# Patient Record
Sex: Female | Born: 1970 | Hispanic: No
Health system: Midwestern US, Community
[De-identification: ages and names within clinical notes are randomized; demographics above are authoritative.]

## PROBLEM LIST (undated history)

## (undated) DIAGNOSIS — G43909 Migraine, unspecified, not intractable, without status migrainosus: Secondary | ICD-10-CM

## (undated) DIAGNOSIS — R079 Chest pain, unspecified: Secondary | ICD-10-CM

## (undated) HISTORY — PX: ABDOMINAL HYSTERECTOMY: SHX81

---

## 2004-09-12 ENCOUNTER — Emergency Department: Payer: Self-pay | Admitting: Emergency Medicine

## 2005-03-25 ENCOUNTER — Emergency Department: Payer: Self-pay | Admitting: Emergency Medicine

## 2008-10-15 ENCOUNTER — Emergency Department: Payer: Self-pay | Admitting: Internal Medicine

## 2016-05-18 ENCOUNTER — Encounter: Payer: Self-pay | Admitting: *Deleted

## 2016-05-18 ENCOUNTER — Emergency Department
Admission: EM | Admit: 2016-05-18 | Discharge: 2016-05-18 | Disposition: A | Discharge status: Home / Self Care | Payer: Self-pay | Attending: Emergency Medicine | Admitting: Emergency Medicine

## 2016-05-18 ENCOUNTER — Emergency Department: Payer: Self-pay

## 2016-05-18 DIAGNOSIS — S63502A Unspecified sprain of left wrist, initial encounter: Secondary | ICD-10-CM | POA: Insufficient documentation

## 2016-05-18 DIAGNOSIS — Y999 Unspecified external cause status: Secondary | ICD-10-CM | POA: Insufficient documentation

## 2016-05-18 DIAGNOSIS — W010XXA Fall on same level from slipping, tripping and stumbling without subsequent striking against object, initial encounter: Secondary | ICD-10-CM | POA: Insufficient documentation

## 2016-05-18 DIAGNOSIS — Y929 Unspecified place or not applicable: Secondary | ICD-10-CM | POA: Insufficient documentation

## 2016-05-18 DIAGNOSIS — Y939 Activity, unspecified: Secondary | ICD-10-CM | POA: Insufficient documentation

## 2016-05-18 MED ORDER — NAPROXEN 500 MG PO TABS: 500.0000 mg | ORAL_TABLET | Freq: Two times a day (BID) | ORAL | 0 refills | Status: AC

## 2016-05-18 NOTE — ED Triage Notes (Addendum)
Pt states she tripped and fell and now has left wrist pain, able to move fingers, radial pulse papable

## 2016-05-18 NOTE — ED Provider Notes (Signed)
[facility] Emergency Department Provider Note  ____________________________________________  Time seen: Approximately 5:16 PM  I have reviewed the triage vital signs and the nursing notes.   HISTORY  Chief Complaint Wrist Pain    HPI Melinda Kennedy is a 45 y.o. female , NAD, presents to emergency for evaluation of left wrist pain. Patient states that when she woke up this afternoon, she got out of the bed but her legs were still "asleep" causing her to fall floor. States she placed her arms out in front of her to brace her fall. Had immediate pain about the left wrist with some swelling and now has difficulty moving the left wrist. States the pain can radiate into the forearm but denies any elbow, upper arm or shoulder pain. Does have mild tingling about the dorsal surface of the left wrist but no numbness or weakness. Denies head injury, LOC, lightheadedness or dizziness. No abdominal pain, nausea, vomiting. No chest pain or shortness of breath. Has no neck or back pain. Has been able to ambulate without difficulties. Denies any open wounds or lacerations.   History reviewed. No pertinent past medical history.  There are no active problems to display for this patient.   History reviewed. No pertinent surgical history.  Prior to Admission medications   Medication Sig Start Date End Date Taking? Authorizing Provider  naproxen (NAPROSYN) 500 MG tablet Take 1 tablet (500 mg total) by mouth 2 (two) times daily with a meal. 05/18/16   Cameka Rae L Sheyna Pettibone, PA-C    Allergies Levaquin [levofloxacin]  History reviewed. No pertinent family history.  Social History Social History  Substance Use Topics  . Smoking status: Not on file  . Smokeless tobacco: Not on file  . Alcohol use Not on file     Review of Systems  Constitutional: No fatigue Eyes: No visual changes.  Cardiovascular: No chest pain. Respiratory: No shortness of breath.  Gastrointestinal: No  abdominal pain.  No nausea, vomiting.  Musculoskeletal:Positive left wrist pain. Negative for back, Neck, shoulder, elbow and pain.  Skin: Eyes of swelling and bruising left wrist. Negative for rash, redness, warmth, open wounds or lacerations. Neurological: Positive tingling dorsal left wrist but no weakness or numbness. No LOC, lightheadedness, dizziness. 10-point ROS otherwise negative.  ____________________________________________   PHYSICAL EXAM:  VITAL SIGNS: ED Triage Vitals  Enc Vitals Group     BP 05/18/16 1711 134/84     Pulse Rate 05/18/16 1711 88     Resp 05/18/16 1711 18     Temp 05/18/16 1711 98.2 F (36.8 C)     Temp Source 05/18/16 1711 Oral     SpO2 05/18/16 1711 97 %     Weight 05/18/16 1708 160 lb (72.6 kg)     Height 05/18/16 1708 5\' 3"  (1.6 m)     Head Circumference --      Peak Flow --      Pain Score 05/18/16 1708 8     Pain Loc --      Pain Edu? --      Excl. in GC? --      Constitutional: Alert and oriented. Well appearing and in no acute distress. Eyes: Conjunctivae are normal Without icterus, injection or hemorrhage.  Head: Atraumatic. Cardiovascular: Good peripheral circulation with 2+ pulses noted in the left upper extremity. Capillary refill is brisk in all digits of left hand. Respiratory: Normal respiratory effort without tachypnea or retractions.  Musculoskeletal: Decreased range of motion of the left wrist due  to pain. Full range of motion of all digits on the left hand without difficulty. No tenderness to palpation about the digits of left hand, the left hand nor the middle or proximal forearm. Tenderness to palpation about the dorsal left wrist with point of maximal tenderness about the ulna without crepitus or bony abnormalities. Full range of motion of left elbow without pain. Full range of motion of the right elbow without pain or difficulty. Neurologic:  Normal speech and language. No gross focal neurologic deficits are appreciated.  Sensation to light touch first contact of left upper extremity. Skin:  Mild swelling and trace ecchymosis is noted about the dorsal left wrist. Skin is warm, dry and intact. No rash, redness, warmth, wounds or lacerations noted. Psychiatric: Mood and affect are normal. Speech and behavior are normal. Patient exhibits appropriate insight and judgement.   ____________________________________________   LABS  None ____________________________________________  EKG  None ____________________________________________  RADIOLOGY I, Hope PigeonJami L Anwyn Kriegel, personally viewed and evaluated these images (plain radiographs) as part of my medical decision making, as well as reviewing the written report by the radiologist.  Dg Wrist Complete Left  Result Date: 05/18/2016 CLINICAL DATA:  Fall on outstretched arm today. Left wrist pain. Initial encounter. EXAM: LEFT WRIST - COMPLETE 3+ VIEW COMPARISON:  None. FINDINGS: There is no evidence of fracture or dislocation. There is no evidence of arthropathy or other focal bone abnormality. Soft tissues are unremarkable. IMPRESSION: Negative. Electronically Signed   By: Myles RosenthalJohn  Stahl M.D.   On: 05/18/2016 17:52    ____________________________________________    PROCEDURES  Procedure(s) performed: None   Procedures   Medications - No data to display   ____________________________________________   INITIAL IMPRESSION / ASSESSMENT AND PLAN / ED COURSE  Pertinent labs & imaging results that were available during my care of the patient were reviewed by me and considered in my medical decision making (see chart for details).  Clinical Course     Patient's diagnosis is consistent with Left wrist sprain. Patient placed in a left cockup wrist splint for comfort care. Patient will be discharged home with prescriptions for naproxen to take as needed. Patient is to follow up with Dr. Hyacinth MeekerMiller in orthopedics in 1 week if symptoms persist past this treatment  course. Patient is given ED precautions to return to the ED for any worsening or new symptoms.   ____________________________________________  FINAL CLINICAL IMPRESSION(S) / ED DIAGNOSES  Final diagnoses:  Sprain of left wrist, initial encounter      NEW MEDICATIONS STARTED DURING THIS VISIT:  New Prescriptions   NAPROXEN (NAPROSYN) 500 MG TABLET    Take 1 tablet (500 mg total) by mouth 2 (two) times daily with a meal.         Hope PigeonJami L Elchonon Maxson, PA-C 05/18/16 1810    Arnaldo NatalPaul F Malinda, MD 05/18/16 2028

## 2016-11-01 ENCOUNTER — Emergency Department
Admission: EM | Admit: 2016-11-01 | Discharge: 2016-11-01 | Disposition: A | Discharge status: Home / Self Care | Payer: Self-pay | Attending: Emergency Medicine | Admitting: Emergency Medicine

## 2016-11-01 DIAGNOSIS — J0100 Acute maxillary sinusitis, unspecified: Secondary | ICD-10-CM | POA: Insufficient documentation

## 2016-11-01 DIAGNOSIS — F17210 Nicotine dependence, cigarettes, uncomplicated: Secondary | ICD-10-CM | POA: Insufficient documentation

## 2016-11-01 MED ORDER — BENZONATATE 100 MG PO CAPS: 200.0000 mg | ORAL_CAPSULE | Freq: Three times a day (TID) | ORAL | 0 refills | Status: AC | PRN

## 2016-11-01 MED ORDER — FEXOFENADINE-PSEUDOEPHED ER 60-120 MG PO TB12: 1.0000 | ORAL_TABLET | Freq: Two times a day (BID) | ORAL | 0 refills | Status: AC

## 2016-11-01 MED ORDER — AMOXICILLIN 500 MG PO CAPS
500.0000 mg | ORAL_CAPSULE | Freq: Three times a day (TID) | ORAL | 0 refills | Status: DC
Start: 2016-11-01 — End: 2016-12-26

## 2016-11-01 NOTE — ED Notes (Signed)
Pt reports to ED w/ c/o nasal congestion and cough x 1 week.  Pt A/OX4, resp even and unlabored. Pt able to speak in complete sentences w/o issue

## 2016-11-01 NOTE — ED Provider Notes (Signed)
[facility] Emergency Department Provider Note   ____________________________________________   First MD Initiated Contact with Patient 11/01/16 1234     (approximate)  I have reviewed the triage vital signs and the nursing notes.   HISTORY  Chief Complaint Nasal Congestion and Cough    HPI Melinda PollackSaundra L Antilla is a 46 y.o. female patient complaining of 1 week of nasal congestion and nonproductive cough. Patient also complaining of ear pressure/pain and facial pain. Patient stated no relief over-the-counter preparations. Patient denies vision disturbance vertigo or nausea.patient rates her pain discomfort as 8/10. She describes the pain as "throbbing/pounding".   History reviewed. No pertinent past medical history.  There are no active problems to display for this patient.   History reviewed. No pertinent surgical history.  Prior to Admission medications   Medication Sig Start Date End Date Taking? Authorizing Provider  amoxicillin (AMOXIL) 500 MG capsule Take 1 capsule (500 mg total) by mouth 3 (three) times daily. 11/01/16   Joni ReiningSmith, Gilmore List K, PA-C  benzonatate (TESSALON PERLES) 100 MG capsule Take 2 capsules (200 mg total) by mouth 3 (three) times daily as needed for cough. 11/01/16 11/01/17  Joni ReiningSmith, Naven Giambalvo K, PA-C  fexofenadine-pseudoephedrine (ALLEGRA-D) 60-120 MG 12 hr tablet Take 1 tablet by mouth 2 (two) times daily. 11/01/16   Joni ReiningSmith, Tirzah Fross K, PA-C  naproxen (NAPROSYN) 500 MG tablet Take 1 tablet (500 mg total) by mouth 2 (two) times daily with a meal. 05/18/16   Hagler, Jami L, PA-C    Allergies Levaquin [levofloxacin]  No family history on file.  Social History Social History  Substance Use Topics  . Smoking status: Current Every Day Smoker    Packs/day: 0.50  . Smokeless tobacco: Never Used  . Alcohol use Not on file    Review of Systems  Constitutional: No fever/chills Eyes: No visual changes. ENT: No sore throat. Sinus, ear, and   pressure Cardiovascular: Denies chest pain. Respiratory: Denies shortness of breath. Nonproductive cough Gastrointestinal: No abdominal pain.  No nausea, no vomiting.  No diarrhea.  No constipation. Genitourinary: Negative for dysuria. Musculoskeletal: Negative for back pain. Skin: Negative for rash. Neurological: Negative for headaches, focal weakness or numbness. Allergic/Immunilogical: levaquin ____________________________________________   PHYSICAL EXAM:  VITAL SIGNS: ED Triage Vitals  Enc Vitals Group     BP 11/01/16 1139 (!) 126/101     Pulse Rate 11/01/16 1139 89     Resp 11/01/16 1139 15     Temp 11/01/16 1139 97.9 F (36.6 C)     Temp Source 11/01/16 1139 Oral     SpO2 11/01/16 1139 97 %     Weight 11/01/16 1138 170 lb (77.1 kg)     Height 11/01/16 1138 5\' 3"  (1.6 m)     Head Circumference --      Peak Flow --      Pain Score 11/01/16 1138 8     Pain Loc --      Pain Edu? --      Excl. in GC? --     Constitutional: Alert and oriented. Well appearing and in no acute distress. Eyes: Conjunctivae are normal. PERRL. EOMI. Head: Atraumatic. Nose: edematous nasal turbinates with thick rhinorrhea Mouth/Throat: Mucous membranes are moist.  Oropharynx non-erythematous.postnasal drainage Neck: No stridor.  No cervical spine tenderness to palpation. Cardiovascular: Normal rate, regular rhythm. Grossly normal heart sounds.  Good peripheral circulation. Respiratory: Normal respiratory effort.  No retractions. Lungs CTAB. Gastrointestinal: Soft and nontender. No distention. No abdominal bruits. No CVA  tenderness. Musculoskeletal: No lower extremity tenderness nor edema.  No joint effusions. Neurologic:  Normal speech and language. No gross focal neurologic deficits are appreciated. No gait instability. Skin:  Skin is warm, dry and intact. No rash noted. Psychiatric: Mood and affect are normal. Speech and behavior are normal.  ____________________________________________     LABS (all labs ordered are listed, but only abnormal results are displayed)  Labs Reviewed - No data to display ____________________________________________  EKG   ____________________________________________  RADIOLOGY   ____________________________________________   PROCEDURES  Procedure(s) performed: None  Procedures  Critical Care performed: No  ____________________________________________   INITIAL IMPRESSION / ASSESSMENT AND PLAN / ED COURSE  Pertinent labs & imaging results that were available during my care of the patient were reviewed by me and considered in my medical decision making (see chart for details).  Sinusitis. Patient given discharge care instructions. Patient [facility].      ____________________________________________   FINAL CLINICAL IMPRESSION(S) / ED DIAGNOSES  Final diagnoses:  Subacute maxillary sinusitis      NEW MEDICATIONS STARTED DURING THIS VISIT:  New Prescriptions   AMOXICILLIN (AMOXIL) 500 MG CAPSULE    Take 1 capsule (500 mg total) by mouth 3 (three) times daily.   BENZONATATE (TESSALON PERLES) 100 MG CAPSULE    Take 2 capsules (200 mg total) by mouth 3 (three) times daily as needed for cough.   FEXOFENADINE-PSEUDOEPHEDRINE (ALLEGRA-D) 60-120 MG 12 HR TABLET    Take 1 tablet by mouth 2 (two) times daily.     Note:  This document was prepared using Dragon voice recognition software and may include unintentional dictation errors.    Joni Reining, PA-C 11/01/16 1253    Minna Antis, MD 11/01/16 1504

## 2016-12-26 ENCOUNTER — Emergency Department
Admission: EM | Admit: 2016-12-26 | Discharge: 2016-12-26 | Disposition: A | Discharge status: Home / Self Care | Payer: Commercial Managed Care - PPO | Attending: Emergency Medicine | Admitting: Emergency Medicine

## 2016-12-26 ENCOUNTER — Encounter: Payer: Self-pay | Admitting: Emergency Medicine

## 2016-12-26 DIAGNOSIS — Z791 Long term (current) use of non-steroidal anti-inflammatories (NSAID): Secondary | ICD-10-CM | POA: Insufficient documentation

## 2016-12-26 DIAGNOSIS — Z79899 Other long term (current) drug therapy: Secondary | ICD-10-CM | POA: Diagnosis not present

## 2016-12-26 DIAGNOSIS — J029 Acute pharyngitis, unspecified: Secondary | ICD-10-CM | POA: Diagnosis not present

## 2016-12-26 DIAGNOSIS — F17200 Nicotine dependence, unspecified, uncomplicated: Secondary | ICD-10-CM | POA: Diagnosis not present

## 2016-12-26 MED ORDER — AMOXICILLIN 500 MG PO CAPS: 500.0000 mg | ORAL_CAPSULE | Freq: Two times a day (BID) | ORAL | 0 refills | Status: AC

## 2016-12-26 MED ORDER — LIDOCAINE VISCOUS 2 % MT SOLN: 10.0000 mL | OROMUCOSAL | 0 refills | Status: AC | PRN

## 2016-12-26 MED ORDER — PREDNISONE 10 MG (21) PO TBPK: ORAL_TABLET | ORAL | 0 refills | Status: AC

## 2016-12-26 NOTE — ED Notes (Signed)
See triage note  States she developed sore throat about 1 week ago   Unsure of fever but has had chills.  States pain has increased today with swallowing  Afebrile on arrival

## 2016-12-26 NOTE — ED Triage Notes (Signed)
Pt here for sore throat since Tuesday. Odynophagia. NAD. Handling secretions. No respiratory distress.

## 2016-12-26 NOTE — ED Provider Notes (Signed)
[facility] Emergency Department Provider Note  ____________________________________________  Time seen: Approximately 8:24 AM  I have reviewed the triage vital signs and the nursing notes.   HISTORY  Chief Complaint Sore Throat    HPI Melinda Kennedy is a 46 y.o. female who presents to emergency department with sore throat and nasal congestion for 1 week. Nasal congestion is improving but sore throat is not getting better. Pain is now radiating to her ears. She has taken Mucinex and gargled saltwater for symptoms. She has had chills but has not checked her temperature. No sick contacts. She is allergic to Levaquin. She smokes one pack of cigarettes per day. She denies cough, shortness of breath, chest pain, nausea, vomiting, abdominal pain.   History reviewed. No pertinent past medical history.  There are no active problems to display for this patient.   History reviewed. No pertinent surgical history.  Prior to Admission medications   Medication Sig Start Date End Date Taking? Authorizing Provider  amoxicillin (AMOXIL) 500 MG capsule Take 1 capsule (500 mg total) by mouth 2 (two) times daily. 12/26/16   Enid Derry, PA-C  benzonatate (TESSALON PERLES) 100 MG capsule Take 2 capsules (200 mg total) by mouth 3 (three) times daily as needed for cough. 11/01/16 11/01/17  Joni Reining, PA-C  fexofenadine-pseudoephedrine (ALLEGRA-D) 60-120 MG 12 hr tablet Take 1 tablet by mouth 2 (two) times daily. 11/01/16   Joni Reining, PA-C  lidocaine (XYLOCAINE) 2 % solution Use as directed 10 mLs in the mouth or throat as needed for mouth pain. 12/26/16   Enid Derry, PA-C  naproxen (NAPROSYN) 500 MG tablet Take 1 tablet (500 mg total) by mouth 2 (two) times daily with a meal. 05/18/16   Hagler, Jami L, PA-C  predniSONE (STERAPRED UNI-PAK 21 TAB) 10 MG (21) TBPK tablet Take 6 tablets on day 1, take 5 tablets on day 2, take 4 tablets on day 3, take 3 tablets on day 4,  take 2 tablets on day 5, take 1 tablet on day 6 12/26/16   Enid Derry, PA-C    Allergies Levaquin [levofloxacin]  History reviewed. No pertinent family history.  Social History Social History  Substance Use Topics  . Smoking status: Current Every Day Smoker    Packs/day: 0.50  . Smokeless tobacco: Never Used  . Alcohol use No     Review of Systems  Constitutional: Positive for chills. Eyes: No visual changes. No discharge. ENT: Positive for congestion and rhinorrhea. Cardiovascular: No chest pain. Respiratory: Negative for cough. No SOB. Gastrointestinal: No abdominal pain.  No nausea, no vomiting.  No diarrhea.  No constipation. Musculoskeletal: Negative for musculoskeletal pain. Skin: Negative for rash, abrasions, lacerations, ecchymosis. Neurological: Negative for headaches.   ____________________________________________   PHYSICAL EXAM:  VITAL SIGNS: ED Triage Vitals  Enc Vitals Group     BP 12/26/16 0741 (!) 131/92     Pulse Rate 12/26/16 0741 94     Resp 12/26/16 0741 16     Temp 12/26/16 0741 97.7 F (36.5 C)     Temp Source 12/26/16 0741 Oral     SpO2 12/26/16 0741 98 %     Weight 12/26/16 0740 170 lb (77.1 kg)     Height 12/26/16 0740 5\' 3"  (1.6 m)     Head Circumference --      Peak Flow --      Pain Score 12/26/16 0740 6     Pain Loc --  Pain Edu? --      Excl. in GC? --      Constitutional: Alert and oriented. Well appearing and in no acute distress. Eyes: Conjunctivae are normal. PERRL. EOMI. No discharge. Head: Atraumatic. ENT: No frontal and maxillary sinus tenderness.      Ears: Tympanic membranes pearly gray with good landmarks. No discharge.      Nose: Mild congestion/rhinnorhea.      Mouth/Throat: Mucous membranes are moist. Oropharynx erythematous. Tonsils not enlarged. No exudates. Uvula midline. Neck: No stridor.   Hematological/Lymphatic/Immunilogical: No cervical lymphadenopathy. Cardiovascular: Normal rate, regular  rhythm.  Good peripheral circulation. Respiratory: Normal respiratory effort without tachypnea or retractions. Lungs CTAB. Good air entry to the bases with no decreased or absent breath sounds. Gastrointestinal: Bowel sounds 4 quadrants. Soft and nontender to palpation. No guarding or rigidity. No palpable masses. No distention. Musculoskeletal: Full range of motion to all extremities. No gross deformities appreciated. Neurologic:  Normal speech and language. No gross focal neurologic deficits are appreciated.  Skin:  Skin is warm, dry and intact. No rash noted.   ____________________________________________   LABS (all labs ordered are listed, but only abnormal results are displayed)  Labs Reviewed - No data to display ____________________________________________  EKG   ____________________________________________  RADIOLOGY  No results found.  ____________________________________________    PROCEDURES  Procedure(s) performed:    Procedures    Medications - No data to display   ____________________________________________   INITIAL IMPRESSION / ASSESSMENT AND PLAN / ED COURSE  Pertinent labs & imaging results that were available during my care of the patient were reviewed by me and considered in my medical decision making (see chart for details).  Review of the [place] CSRS was performed in accordance of the [facility] prior to dispensing any controlled drugs.   Patient presented to the emergency department with sore throat for 1 week. Vital signs and exam are reassuring. Patient feels comfortable going home. Patient will be discharged home with prescriptions for amoxicillin, prednisone, Viscous Lidocaine. Patient is to follow up with PCP as needed or otherwise directed. Patient is given ED precautions to return to the ED for any worsening or new symptoms.     ____________________________________________  FINAL CLINICAL IMPRESSION(S) / ED DIAGNOSES  Final  diagnoses:  Pharyngitis, unspecified etiology      NEW MEDICATIONS STARTED DURING THIS VISIT:  Discharge Medication List as of 12/26/2016  8:29 AM    START taking these medications   Details  lidocaine (XYLOCAINE) 2 % solution Use as directed 10 mLs in the mouth or throat as needed for mouth pain., Starting Mon 12/26/2016, Print    predniSONE (STERAPRED UNI-PAK 21 TAB) 10 MG (21) TBPK tablet Take 6 tablets on day 1, take 5 tablets on day 2, take 4 tablets on day 3, take 3 tablets on day 4, take 2 tablets on day 5, take 1 tablet on day 6, Print            This chart was dictated using voice recognition software/Dragon. Despite best efforts to proofread, errors can occur which can change the meaning. Any change was purely unintentional.    Enid DerryWagner, Ulisses Vondrak, PA-C 12/26/16 1036    Arnaldo NatalMalinda, Paul F, MD 12/26/16 [phone]

## 2017-02-27 ENCOUNTER — Ambulatory Visit: Payer: Commercial Managed Care - PPO | Admitting: Internal Medicine

## 2017-04-13 ENCOUNTER — Emergency Department
Admission: EM | Admit: 2017-04-13 | Discharge: 2017-04-13 | Disposition: A | Discharge status: Home / Self Care | Payer: Commercial Managed Care - PPO | Attending: Emergency Medicine | Admitting: Emergency Medicine

## 2017-04-13 ENCOUNTER — Emergency Department: Payer: Commercial Managed Care - PPO

## 2017-04-13 ENCOUNTER — Encounter: Payer: Self-pay | Admitting: Emergency Medicine

## 2017-04-13 DIAGNOSIS — F17210 Nicotine dependence, cigarettes, uncomplicated: Secondary | ICD-10-CM | POA: Diagnosis not present

## 2017-04-13 DIAGNOSIS — R202 Paresthesia of skin: Secondary | ICD-10-CM | POA: Insufficient documentation

## 2017-04-13 DIAGNOSIS — R51 Headache: Secondary | ICD-10-CM | POA: Insufficient documentation

## 2017-04-13 DIAGNOSIS — R519 Headache, unspecified: Secondary | ICD-10-CM

## 2017-04-13 HISTORY — DX: Migraine, unspecified, not intractable, without status migrainosus: G43.909

## 2017-04-13 LAB — CBC
HCT: 41.4 % (ref 35.0–47.0)
Hemoglobin: 14.1 g/dL (ref 12.0–16.0)
MCH: 33.6 pg (ref 26.0–34.0)
MCHC: 34 g/dL (ref 32.0–36.0)
MCV: 99 fL (ref 80.0–100.0)
Platelets: 319 K/uL (ref 150–440)
RBC: 4.18 MIL/uL (ref 3.80–5.20)
RDW: 13.1 % (ref 11.5–14.5)
WBC: 9.7 K/uL (ref 3.6–11.0)

## 2017-04-13 LAB — BASIC METABOLIC PANEL
ANION GAP: 10 (ref 5–15)
BUN: 9 mg/dL (ref 6–20)
CO2: 24 mmol/L (ref 22–32)
Calcium: 9.5 mg/dL (ref 8.9–10.3)
Chloride: 106 mmol/L (ref 101–111)
Creatinine, Ser: 0.65 mg/dL (ref 0.44–1.00)
Glucose, Bld: 97 mg/dL (ref 65–99)
POTASSIUM: 3.6 mmol/L (ref 3.5–5.1)
SODIUM: 140 mmol/L (ref 135–145)

## 2017-04-13 MED ORDER — LORAZEPAM 1 MG PO TABS: 1.0000 mg | ORAL_TABLET | Freq: Three times a day (TID) | ORAL | 0 refills | Status: AC | PRN

## 2017-04-13 MED ORDER — KETOROLAC TROMETHAMINE 30 MG/ML IJ SOLN
30.0000 mg | Freq: Once | INTRAMUSCULAR | Status: AC
  Administered 2017-04-13: 30 mg via INTRAVENOUS
  Filled 2017-04-13: qty 1

## 2017-04-13 MED ORDER — BUTALBITAL-APAP-CAFFEINE 50-325-40 MG PO TABS: 1.0000 | ORAL_TABLET | Freq: Four times a day (QID) | ORAL | 0 refills | Status: AC | PRN

## 2017-04-13 MED ORDER — SODIUM CHLORIDE 0.9 % IV SOLN
Freq: Once | INTRAVENOUS | Status: AC
  Administered 2017-04-13: 16:00:00 via INTRAVENOUS

## 2017-04-13 MED ORDER — METOCLOPRAMIDE HCL 5 MG/ML IJ SOLN
10.0000 mg | Freq: Once | INTRAMUSCULAR | Status: AC
  Administered 2017-04-13: 10 mg via INTRAVENOUS
  Filled 2017-04-13: qty 2

## 2017-04-13 NOTE — ED Notes (Signed)
Pt states she has pain in the head not a migraine that started yesterday and has gotten worse. Pt is NAD at this time.

## 2017-04-13 NOTE — ED Triage Notes (Signed)
Pt in via POV with complaints of left posterior headache, pt states, "its not a headache, I have migraines and this is a different pain than I have ever had."  Pt denies any changes to vision, denies photo sensitivity.  Pt also reports left side numbness to face, face symmetrical at this time.  Pt ambulatory to triage, vitals WDL, NAD noted at this time.

## 2017-04-13 NOTE — ED Notes (Addendum)
Pt presentation discussed with Dr. Mayford KnifeWilliams, see new orders.

## 2017-04-13 NOTE — ED Provider Notes (Signed)
[facility] Emergency Department Provider Note       Time seen: ----------------------------------------- 3:41 PM on 04/13/2017 -----------------------------------------    I have reviewed the triage vital signs and the nursing notes.  HISTORY   Chief Complaint Headache    HPI Melinda Kennedy is a 46 y.o. female with a history of migraines who presents to the ED for a posterior headache.  Patient states this is not her typical migraine.  She states she is also having some left sided facial paresthesias.  She denies any changes in her vision.  She denies fevers, chills or other complaints.  Pain is 5 out of 10 posteriorly.  Patient states it hurts to brush her hair and her scalp is tender.  Past Medical History:  Diagnosis Date  . Migraines     There are no active problems to display for this patient.   Past Surgical History:  Procedure Laterality Date  . ABDOMINAL HYSTERECTOMY      Allergies Levaquin [levofloxacin]  Social History Social History   Tobacco Use  . Smoking status: Current Every Day Smoker    Packs/day: 0.50    Types: Cigarettes  . Smokeless tobacco: Never Used  Substance Use Topics  . Alcohol use: No  . Drug use: No    Review of Systems Constitutional: Negative for fever. Eyes: Negative for vision changes ENT:  Negative for congestion, sore throat Cardiovascular: Negative for chest pain. Respiratory: Negative for shortness of breath. Gastrointestinal: Negative for abdominal pain, vomiting and diarrhea. Genitourinary: Negative for dysuria. Musculoskeletal: Negative for back pain. Skin: Negative for rash. Neurological: Positive for headache and facial paresthesias.  All systems negative/normal/unremarkable except as stated in the HPI  ____________________________________________   PHYSICAL EXAM:  VITAL SIGNS: ED Triage Vitals  Enc Vitals Group     BP 04/13/17 1447 109/79     Pulse Rate 04/13/17 1447 86      Resp 04/13/17 1447 16     Temp 04/13/17 1447 98.4 F (36.9 C)     Temp Source 04/13/17 1447 Oral     SpO2 04/13/17 1447 96 %     Weight 04/13/17 1448 169 lb (76.7 kg)     Height 04/13/17 1448 5\' 3"  (1.6 m)     Head Circumference --      Peak Flow --      Pain Score 04/13/17 1447 5     Pain Loc --      Pain Edu? --      Excl. in GC? --     Constitutional: Alert and oriented. Well appearing and in no distress. Eyes: Conjunctivae are normal. Normal extraocular movements. ENT   Head: Normocephalic and atraumatic.   Nose: No congestion/rhinnorhea.   Mouth/Throat: Mucous membranes are moist.   Neck: No stridor. Cardiovascular: Normal rate, regular rhythm. No murmurs, rubs, or gallops. Respiratory: Normal respiratory effort without tachypnea nor retractions. Breath sounds are clear and equal bilaterally. No wheezes/rales/rhonchi. Gastrointestinal: Soft and nontender. Normal bowel sounds Musculoskeletal: Nontender with normal range of motion in extremities. No lower extremity tenderness nor edema. Neurologic:  Normal speech and language.  Left facial paresthesias are noted.  Strength, sensation, cranial nerves are normal except the right eyebrow does not go up when she raises her eyebrows.  Patient states that is chronic. Skin:  Skin is warm, dry and intact. No rash noted. Psychiatric: Mood and affect are normal. Speech and behavior are normal.  ____________________________________________  EKG: Interpreted by me.  Sinus rhythm rate 67 bpm,  normal PR interval, normal QRS, normal QT.  ____________________________________________  ED COURSE:  Pertinent labs & imaging results that were available during my care of the patient were reviewed by me and considered in my medical decision making (see chart for details). Patient presents for paresthesias and headache, we will assess with labs and imaging as indicated.   Procedures ____________________________________________    LABS (pertinent positives/negatives)  Labs Reviewed  BASIC METABOLIC PANEL  CBC  URINALYSIS, COMPLETE (UACMP) WITH MICROSCOPIC  CBG MONITORING, ED    RADIOLOGY Images were viewed by me  CT head Was unremarkable MRI and MRA IMPRESSION: 1. Normal noncontrast MRI head. 2. Normal noncontrast MRA head. ____________________________________________  DIFFERENTIAL DIAGNOSIS   Migraine, tension headache, cluster headache, CVA, sinus thrombosis  FINAL ASSESSMENT AND PLAN  Headache, paresthesias   Plan: Patient had presented for unusual headache as well as left facial paresthesia. Patient's labs were reassuring. Patient's imaging were also reassuring including CT and MRI imaging.  I will prescribe some headache medicine for her as well as anxiety medicine to take as needed.  She reports to being under a lot of stress.  She is stable for outpatient follow-up.   Emily FilbertWilliams, Jonathan E, MD   Note: This note was generated in part or whole with voice recognition software. Voice recognition is usually quite accurate but there are transcription errors that can and very often do occur. I apologize for any typographical errors that were not detected and corrected.     Emily FilbertWilliams, Jonathan E, MD 04/13/17 [id number] [id number]

## 2017-07-08 ENCOUNTER — Encounter: Payer: Self-pay | Admitting: Emergency Medicine

## 2017-07-08 ENCOUNTER — Other Ambulatory Visit: Payer: Self-pay

## 2017-07-08 ENCOUNTER — Emergency Department: Payer: Commercial Managed Care - PPO

## 2017-07-08 ENCOUNTER — Emergency Department
Admission: EM | Admit: 2017-07-08 | Discharge: 2017-07-08 | Disposition: A | Discharge status: Home / Self Care | Payer: Commercial Managed Care - PPO | Attending: Emergency Medicine | Admitting: Emergency Medicine

## 2017-07-08 DIAGNOSIS — M7731 Calcaneal spur, right foot: Secondary | ICD-10-CM | POA: Insufficient documentation

## 2017-07-08 DIAGNOSIS — Z79899 Other long term (current) drug therapy: Secondary | ICD-10-CM | POA: Insufficient documentation

## 2017-07-08 DIAGNOSIS — F17210 Nicotine dependence, cigarettes, uncomplicated: Secondary | ICD-10-CM | POA: Insufficient documentation

## 2017-07-08 DIAGNOSIS — Y999 Unspecified external cause status: Secondary | ICD-10-CM | POA: Insufficient documentation

## 2017-07-08 DIAGNOSIS — W228XXA Striking against or struck by other objects, initial encounter: Secondary | ICD-10-CM | POA: Insufficient documentation

## 2017-07-08 DIAGNOSIS — Y939 Activity, unspecified: Secondary | ICD-10-CM | POA: Diagnosis not present

## 2017-07-08 DIAGNOSIS — Y929 Unspecified place or not applicable: Secondary | ICD-10-CM | POA: Insufficient documentation

## 2017-07-08 DIAGNOSIS — S99921A Unspecified injury of right foot, initial encounter: Secondary | ICD-10-CM

## 2017-07-08 DIAGNOSIS — M79671 Pain in right foot: Secondary | ICD-10-CM | POA: Diagnosis present

## 2017-07-08 MED ORDER — IBUPROFEN 600 MG PO TABS: 600.0000 mg | ORAL_TABLET | Freq: Four times a day (QID) | ORAL | 0 refills | Status: AC | PRN

## 2017-07-08 NOTE — ED Notes (Addendum)
Pt here with CC of R foot pain. She describes the pain as constant that is stabbing when she puts weight on it, but it will throb even at rest. She states the pain is 7-8/10 when ambulating. She states that she is unable to put weight on it in a normal gait, but is able to walk by placing weight on her heel. She states that she dropped a heavy box on her foot 3 days ago and the pain has been getting worse since then. She has been trying ibuprofen at home and it has minimally helped the pain. VSS: BP 115/77 (BP Location: Right Arm)   Pulse 66   Temp 98.1 F (36.7 C) (Oral)   Resp 16   Ht 5\' 3"  (1.6 m)   Wt 170 lb (77.1 kg)   SpO2 98%   BMI 30.11 kg/m . Pt resting in bed with daughter at bedside.

## 2017-07-08 NOTE — ED Provider Notes (Signed)
[facility] Emergency Department Provider Note  ____________________________________________  Time seen: Approximately 11:52 AM  I have reviewed the triage vital signs and the nursing notes.   HISTORY  Chief Complaint Foot Pain    HPI Melinda Kennedy is a 47 y.o. female that presents emergency department for evaluation of right foot pain for 3 days after dropping a heavy box on her foot.  Box contained a motorized scooter.  Pain is worse with standing but also at rest.  She has had some numbness on and off in her toes.  She has been taking ibuprofen with some relief.  She decided to come to the emergency room because pain has not improved.  No additional injuries.  No tingling.  Past Medical History:  Diagnosis Date  . Migraines     There are no active problems to display for this patient.   Past Surgical History:  Procedure Laterality Date  . ABDOMINAL HYSTERECTOMY      Prior to Admission medications   Medication Sig Start Date End Date Taking? Authorizing Provider  amoxicillin (AMOXIL) 500 MG capsule Take 1 capsule (500 mg total) by mouth 2 (two) times daily. 12/26/16   Enid Derry, PA-C  benzonatate (TESSALON PERLES) 100 MG capsule Take 2 capsules (200 mg total) by mouth 3 (three) times daily as needed for cough. 11/01/16 11/01/17  Joni Reining, PA-C  butalbital-acetaminophen-caffeine (FIORICET, ESGIC) [phone] MG tablet Take 1-2 tablets every 6 (six) hours as needed by mouth for headache. 04/13/17 04/13/18  Emily Filbert, MD  fexofenadine-pseudoephedrine (ALLEGRA-D) 60-120 MG 12 hr tablet Take 1 tablet by mouth 2 (two) times daily. 11/01/16   Joni Reining, PA-C  ibuprofen (ADVIL,MOTRIN) 600 MG tablet Take 1 tablet (600 mg total) by mouth every 6 (six) hours as needed. 07/08/17   Enid Derry, PA-C  lidocaine (XYLOCAINE) 2 % solution Use as directed 10 mLs in the mouth or throat as needed for mouth pain. 12/26/16   Enid Derry, PA-C   LORazepam (ATIVAN) 1 MG tablet Take 1 tablet (1 mg total) every 8 (eight) hours as needed by mouth for anxiety. 04/13/17 04/13/18  Emily Filbert, MD  naproxen (NAPROSYN) 500 MG tablet Take 1 tablet (500 mg total) by mouth 2 (two) times daily with a meal. 05/18/16   Hagler, Jami L, PA-C  predniSONE (STERAPRED UNI-PAK 21 TAB) 10 MG (21) TBPK tablet Take 6 tablets on day 1, take 5 tablets on day 2, take 4 tablets on day 3, take 3 tablets on day 4, take 2 tablets on day 5, take 1 tablet on day 6 12/26/16   Enid Derry, PA-C    Allergies Levaquin [levofloxacin]  No family history on file.  Social History Social History   Tobacco Use  . Smoking status: Current Every Day Smoker    Packs/day: 0.50    Types: Cigarettes  . Smokeless tobacco: Never Used  Substance Use Topics  . Alcohol use: No  . Drug use: No     Review of Systems  Cardiovascular: No chest pain. Respiratory:  No SOB. Gastrointestinal: No abdominal pain.  No nausea, no vomiting.  Musculoskeletal: Positive for foot pain. Skin: Negative for rash, abrasions, lacerations.  Positive for ecchymosis. Neurological: Negative for  tingling   ____________________________________________   PHYSICAL EXAM:  VITAL SIGNS: ED Triage Vitals  Enc Vitals Group     BP 07/08/17 1023 (!) 131/91     Pulse Rate 07/08/17 1023 93     Resp 07/08/17 1023  20     Temp 07/08/17 1023 98.3 F (36.8 C)     Temp Source 07/08/17 1023 Oral     SpO2 07/08/17 1023 98 %     Weight 07/08/17 1024 170 lb (77.1 kg)     Height 07/08/17 1024 5\' 3"  (1.6 m)     Head Circumference --      Peak Flow --      Pain Score 07/08/17 1024 5     Pain Loc --      Pain Edu? --      Excl. in GC? --      Constitutional: Alert and oriented. Well appearing and in no acute distress. Eyes: Conjunctivae are normal. PERRL. EOMI. Head: Atraumatic. ENT:      Ears:      Nose: No congestion/rhinnorhea.      Mouth/Throat: Mucous membranes are moist.  Neck: No  stridor.  Cardiovascular: Normal rate, regular rhythm.  Good peripheral circulation. Symmetric dorsalis pedis pulses bilaterally. Respiratory: Normal respiratory effort without tachypnea or retractions. Lungs CTAB. Good air entry to the bases with no decreased or absent breath sounds. Musculoskeletal: Full range of motion to all extremities. No gross deformities appreciated.  No visible swelling to foot. Neurologic:  Normal speech and language. No gross focal neurologic deficits are appreciated.  Skin:  Skin is warm, dry and intact. 1 cm area of ecchymosis to 1st and 5th metatarsals.     ____________________________________________   LABS (all labs ordered are listed, but only abnormal results are displayed)  Labs Reviewed - No data to display ____________________________________________  EKG   ____________________________________________  RADIOLOGY Lexine Baton, personally viewed and evaluated these images (plain radiographs) as part of my medical decision making, as well as reviewing the written report by the radiologist.  Dg Foot Complete Right  Result Date: 07/08/2017 CLINICAL DATA:  -dropped very heavy box on top of right foo t3 days ago, tender and bruised dorsal right foot around 1st proximal MT area, bruising into great to area also. Unable to fully stand or bear wt. EXAM: RIGHT FOOT COMPLETE - 3+ VIEW COMPARISON:  None. FINDINGS: There is no evidence of fracture or dislocation. Small calcaneal spur at the insertion of the plantar aponeurosis. There is no evidence of arthropathy or other focal bone abnormality. Soft tissues are unremarkable. IMPRESSION: Small calcaneal spur, otherwise negative. Electronically Signed   By: Corlis Leak M.D.   On: 07/08/2017 10:52    ____________________________________________    PROCEDURES  Procedure(s) performed:    Procedures    Medications - No data to display   ____________________________________________   INITIAL  IMPRESSION / ASSESSMENT AND PLAN / ED COURSE  Pertinent labs & imaging results that were available during my care of the patient were reviewed by me and considered in my medical decision making (see chart for details).  Review of the [place] CSRS was performed in accordance of the [facility] prior to dispensing any controlled drugs.     Patient presented to the emergency department for evaluation of foot injury.  Vital signs and exam are reassuring.  X-ray negative for acute bony abnormalities.  Crutches and postop shoe were given.  Patient will be discharged home with prescriptions for ibuprofen. Patient is to follow up with PCP as directed. Patient is given ED precautions to return to the ED for any worsening or new symptoms.     ____________________________________________  FINAL CLINICAL IMPRESSION(S) / ED DIAGNOSES  Final diagnoses:  Injury of right foot, initial encounter  NEW MEDICATIONS STARTED DURING THIS VISIT:  ED Discharge Orders        Ordered    ibuprofen (ADVIL,MOTRIN) 600 MG tablet  Every 6 hours PRN     07/08/17 1209          This chart was dictated using voice recognition software/Dragon. Despite best efforts to proofread, errors can occur which can change the meaning. Any change was purely unintentional.    Enid DerryWagner, Tryson Lumley, PA-C 07/08/17 1327    Minna AntisPaduchowski, Kevin, MD 07/08/17 [phone]

## 2017-07-08 NOTE — ED Triage Notes (Signed)
r foot pain since heavy box fell on it 3 days ago.

## 2018-10-16 IMAGING — CT CT HEAD W/O CM
3 series · 15 of 47 positions shown, 18 images · non-contrast
Comparison: None.

CLINICAL DATA: Left occipital headache with left-sided numbness to
the face.

EXAM:
CT HEAD WITHOUT CONTRAST
TECHNIQUE: Contiguous axial images were obtained from the base of the skull
through the vertex without intravenous contrast.

[Series 2: head wo · axial · 0.41mm/px · z∈[+616,+741]mm · 9 of 30 slices shown, 12 images]
[im 3/30  brain]
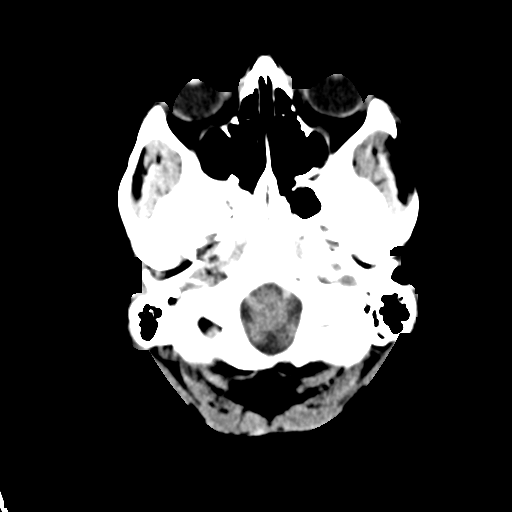
[im 3/30  bone]
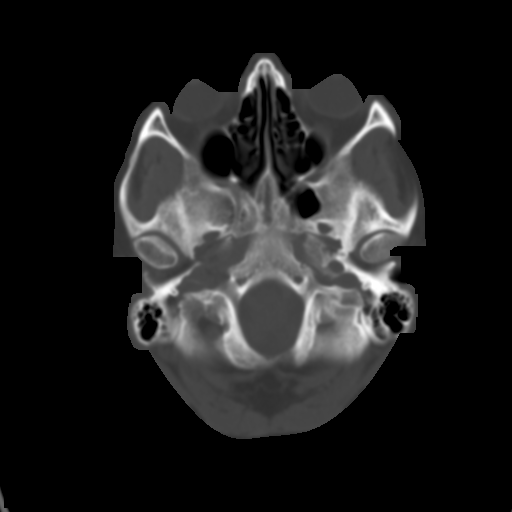
[im 6/30  brain]
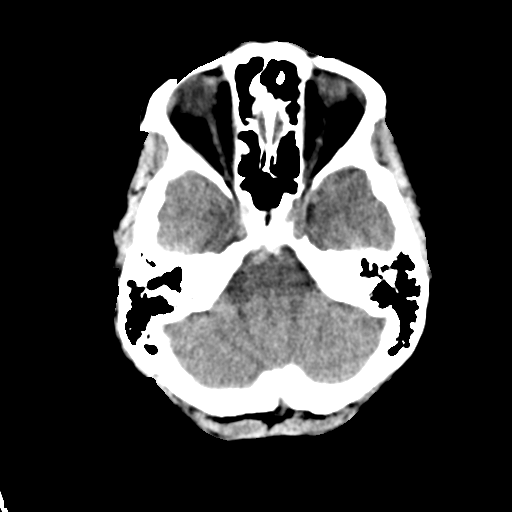
[im 9/30  brain]
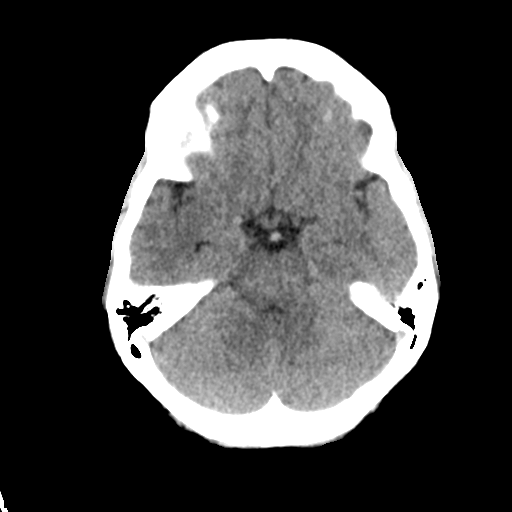
[im 12/30  brain]
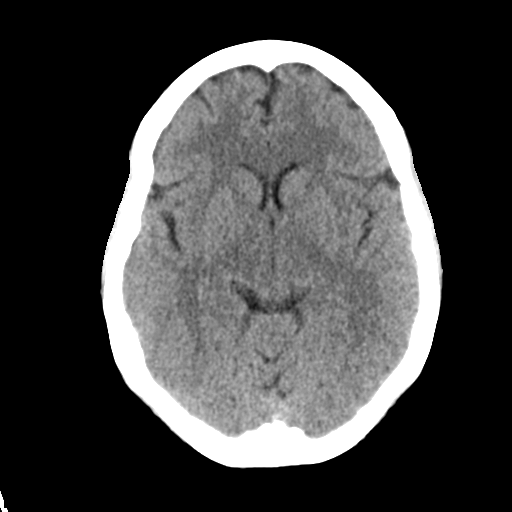
[im 16/30  brain]
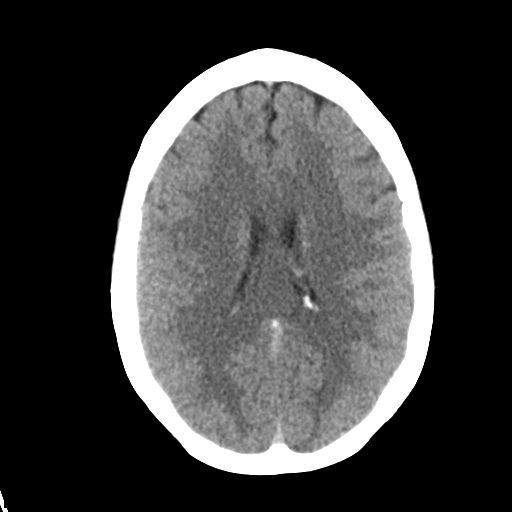
[im 16/30  bone]
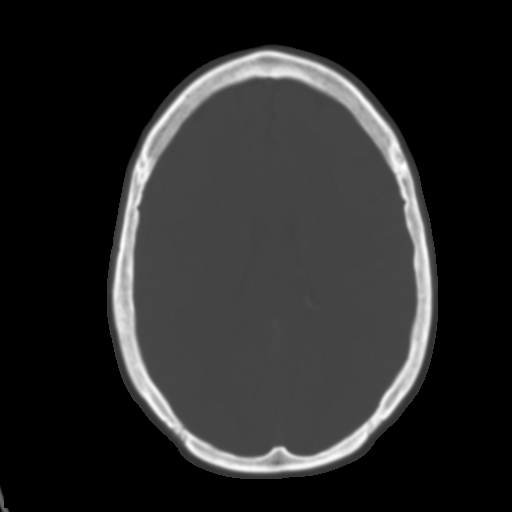
[im 19/30  brain]
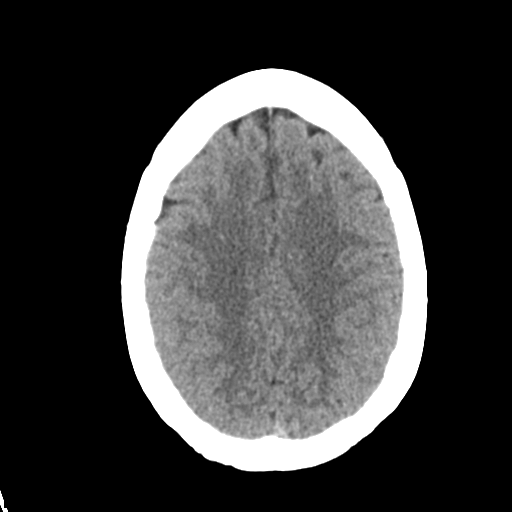
[im 22/30  brain]
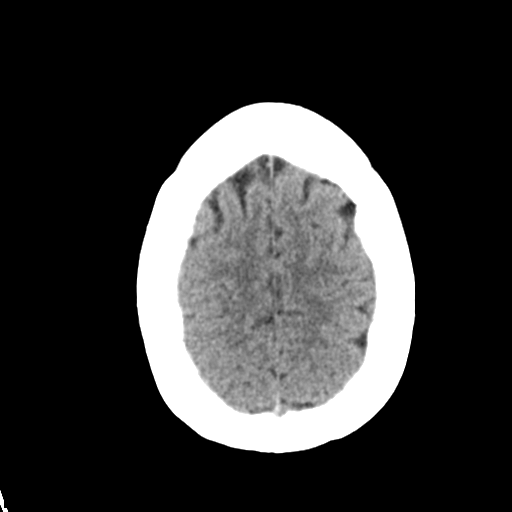
[im 25/30  brain]
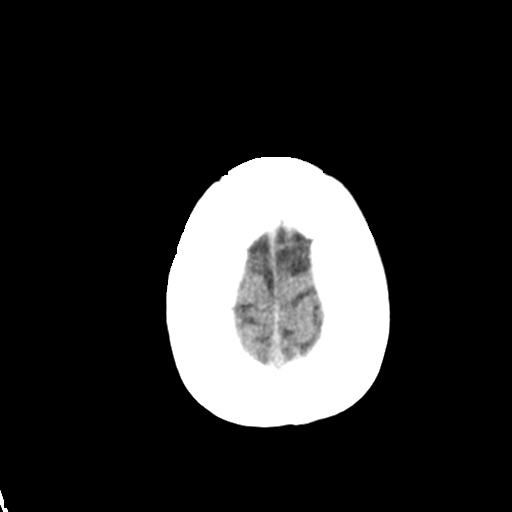
[im 28/30  brain]
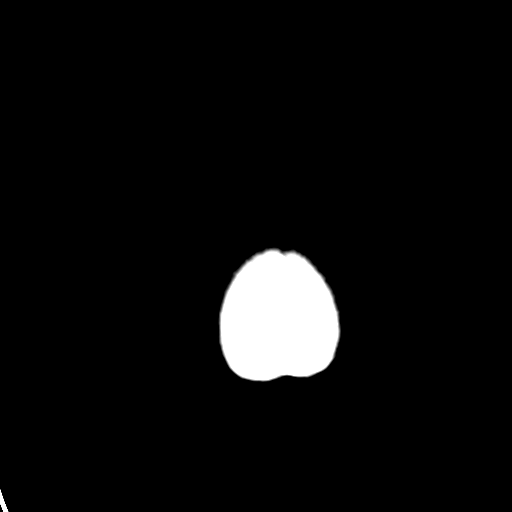
[im 28/30  bone]
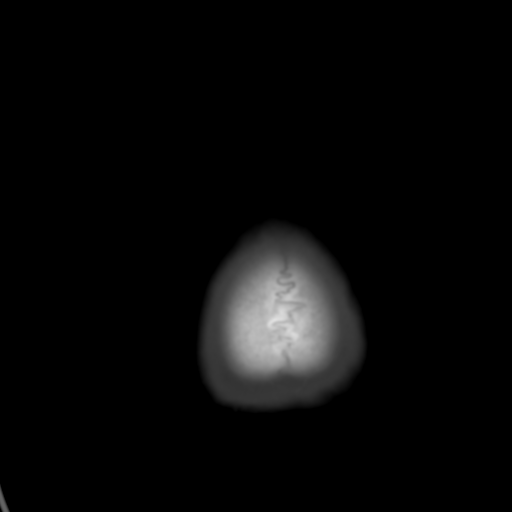

[Series 4: coronal soft tissue · coronal · 0.28mm/px · 3 of 61 slices shown]
[im 21/61  brain]
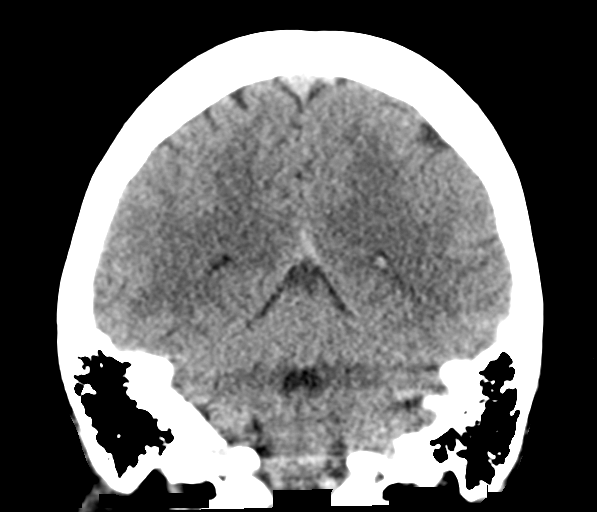
[im 27/61  brain]
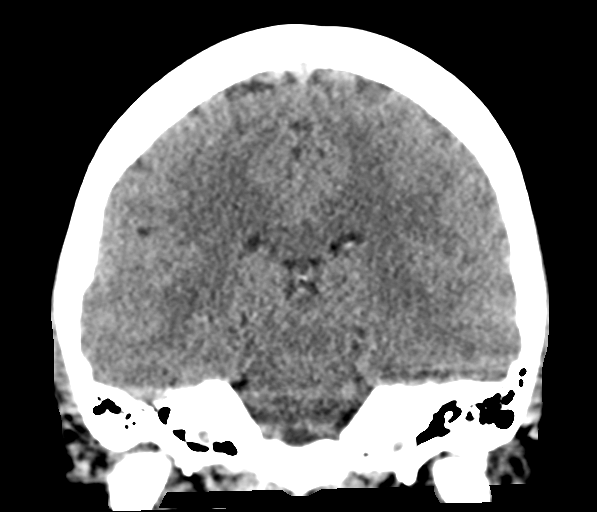
[im 34/61  brain]
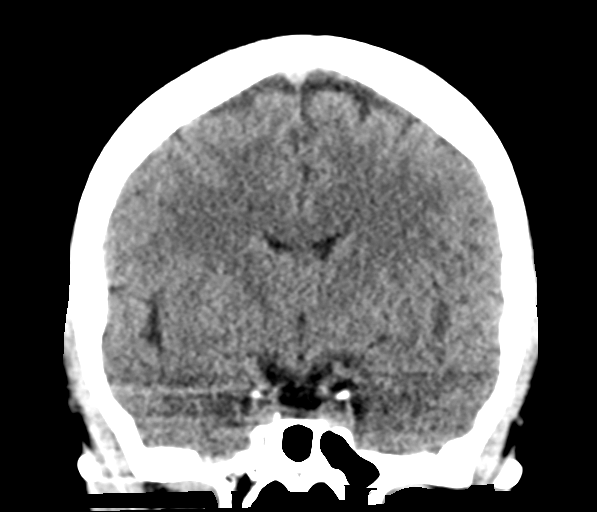

[Series 5: sagittal soft tissue · sagittal · 0.30mm/px · 3 of 48 slices shown]
[im 16/48  brain]
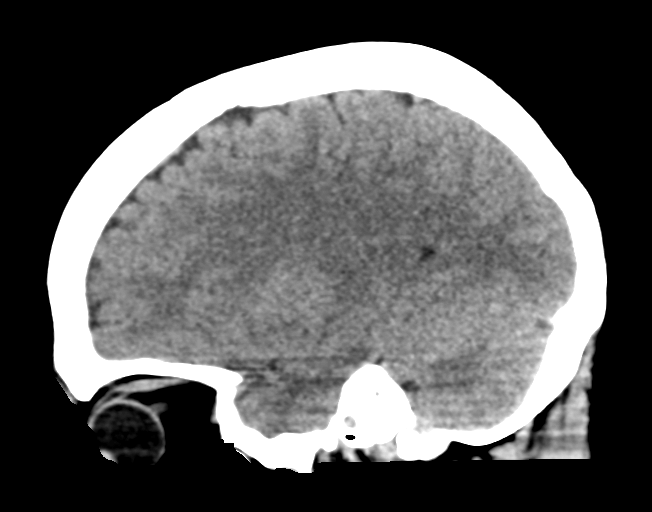
[im 24/48  brain]
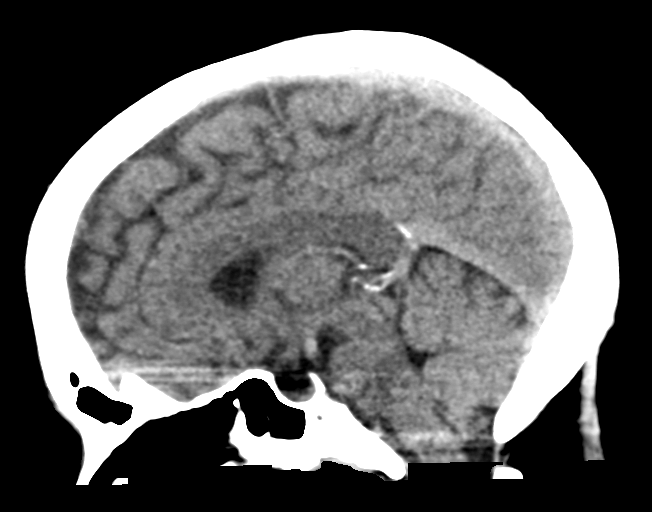
[im 32/48  brain]
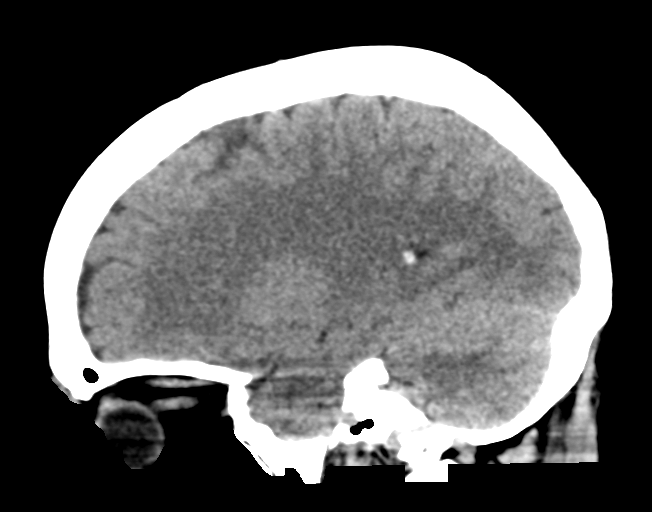

[15 of 47 positions shown; findings below may reference images not displayed]

FINDINGS: Brain: No evidence of acute infarction, hemorrhage, hydrocephalus,
extra-axial collection or mass lesion/mass effect.

Vascular: No hyperdense vessel or unexpected calcification.

Skull: Normal. Negative for fracture or focal lesion.

Sinuses/Orbits: No acute finding.

Other: None
IMPRESSION: No acute intracranial abnormality.

## 2020-07-13 ENCOUNTER — Emergency Department: Admit: 2020-07-13 | Payer: MEDICAID

## 2020-07-13 ENCOUNTER — Inpatient Hospital Stay
Admit: 2020-07-13 | Discharge: 2020-07-15 | Disposition: A | Discharge status: Home / Self Care | Payer: MEDICAID | Attending: Surgery | Admitting: Surgery

## 2020-07-13 DIAGNOSIS — K3580 Unspecified acute appendicitis: Secondary | ICD-10-CM

## 2020-07-13 LAB — CBC WITH AUTOMATED DIFF
ABS. BASOPHILS: 0 10*3/uL (ref 0.0–0.1)
ABS. EOSINOPHILS: 0.1 10*3/uL (ref 0.0–0.4)
ABS. IMM. GRANS.: 0 10*3/uL (ref 0.00–0.04)
ABS. LYMPHOCYTES: 3.4 10*3/uL (ref 0.8–3.5)
ABS. MONOCYTES: 0.5 10*3/uL (ref 0.0–1.0)
ABS. NEUTROPHILS: 3.8 10*3/uL (ref 1.8–8.0)
ABSOLUTE NRBC: 0 10*3/uL (ref 0.00–0.01)
BASOPHILS: 1 % (ref 0–1)
EOSINOPHILS: 2 % (ref 0–7)
HCT: 46.7 % (ref 35.0–47.0)
HGB: 15.6 g/dL (ref 11.5–16.0)
IMMATURE GRANULOCYTES: 0 % (ref 0–0.5)
LYMPHOCYTES: 43 % (ref 12–49)
MCH: 33.3 PG (ref 26.0–34.0)
MCHC: 33.4 g/dL (ref 30.0–36.5)
MCV: 99.8 FL — ABNORMAL HIGH (ref 80.0–99.0)
MONOCYTES: 6 % (ref 5–13)
MPV: 10.9 FL (ref 8.9–12.9)
NEUTROPHILS: 48 % (ref 32–75)
NRBC: 0 PER 100 WBC
PLATELET: 266 10*3/uL (ref 150–400)
RBC: 4.68 M/uL (ref 3.80–5.20)
RDW: 11.9 % (ref 11.5–14.5)
WBC: 7.9 10*3/uL (ref 3.6–11.0)

## 2020-07-13 LAB — URINALYSIS W/MICROSCOPIC
Bacteria: NEGATIVE /hpf
Bilirubin: NEGATIVE
Glucose: NEGATIVE mg/dL
Ketone: NEGATIVE mg/dL
Leukocyte Esterase: NEGATIVE
Nitrites: NEGATIVE
Protein: NEGATIVE mg/dL
Specific gravity: 1.008 (ref 1.003–1.030)
Urobilinogen: 0.1 EU/dL (ref 0.1–1.0)
pH (UA): 6 (ref 5.0–8.0)

## 2020-07-13 LAB — METABOLIC PANEL, COMPREHENSIVE
A-G Ratio: 0.9 — ABNORMAL LOW (ref 1.1–2.2)
ALT (SGPT): 64 U/L (ref 12–78)
Albumin: 3.9 g/dL (ref 3.5–5.0)
Alk. phosphatase: 87 U/L (ref 45–117)
Anion gap: 6 mmol/L (ref 5–15)
BUN/Creatinine ratio: 8 — ABNORMAL LOW (ref 12–20)
BUN: 6 mg/dL (ref 6–20)
Bilirubin, total: 0.6 mg/dL (ref 0.2–1.0)
CO2: 22 mmol/L (ref 21–32)
Calcium: 9.5 mg/dL (ref 8.5–10.1)
Chloride: 110 mmol/L — ABNORMAL HIGH (ref 97–108)
Creatinine: 0.72 mg/dL (ref 0.55–1.02)
GFR est AA: >60 mL/min/{1.73_m2} (ref >60)
GFR est non-AA: >60 mL/min/{1.73_m2} (ref >60)
Globulin: 4.2 g/dL — ABNORMAL HIGH (ref 2.0–4.0)
Glucose: 91 mg/dL (ref 65–100)
Protein, total: 8.1 g/dL (ref 6.4–8.2)
Sodium: 138 mmol/L (ref 136–145)

## 2020-07-13 LAB — LIPASE
Lipase: 108 U/L (ref 73–393)
Lipase: 108 U/L (ref 73–393)

## 2020-07-13 LAB — HCG QL SERUM
HCG(Serum) Pregnancy Test: NEGATIVE
HCG, Ql.: NEGATIVE

## 2020-07-13 LAB — URINALYSIS WITH MICROSCOPIC
BACTERIA, URINE: NEGATIVE /hpf
Bilirubin, Urine: NEGATIVE
Glucose, Ur: NEGATIVE mg/dL
Ketones, Urine: NEGATIVE mg/dL
Leukocyte Esterase, Urine: NEGATIVE
Nitrite, Urine: NEGATIVE
Protein, UA: NEGATIVE mg/dL
Specific Gravity, UA: 1.008 (ref 1.003–1.030)
Urobilinogen, UA, POCT: 0.1 EU/dL (ref 0.1–1.0)
pH, UA: 6 (ref 5.0–8.0)

## 2020-07-13 LAB — COMPREHENSIVE METABOLIC PANEL
ALT: 64 U/L (ref 12–78)
Albumin/Globulin Ratio: 0.9 — ABNORMAL LOW (ref 1.1–2.2)
Albumin: 3.9 g/dL (ref 3.5–5.0)
Alkaline Phosphatase: 87 U/L (ref 45–117)
Anion Gap: 6 mmol/L (ref 5–15)
BUN: 6 mg/dL (ref 6–20)
Bun/Cre Ratio: 8 — ABNORMAL LOW (ref 12–20)
CO2: 22 mmol/L (ref 21–32)
Calcium: 9.5 mg/dL (ref 8.5–10.1)
Chloride: 110 mmol/L — ABNORMAL HIGH (ref 97–108)
Creatinine: 0.72 mg/dL (ref 0.55–1.02)
EGFR IF NonAfrican American: >60 mL/min/{1.73_m2} (ref >60)
GFR African American: >60 mL/min/{1.73_m2} (ref >60)
Globulin: 4.2 g/dL — ABNORMAL HIGH (ref 2.0–4.0)
Glucose: 91 mg/dL (ref 65–100)
Sodium: 138 mmol/L (ref 136–145)
Total Bilirubin: 0.6 mg/dL (ref 0.2–1.0)
Total Protein: 8.1 g/dL (ref 6.4–8.2)

## 2020-07-13 LAB — CBC WITH AUTO DIFFERENTIAL
Basophils %: 1 % (ref 0–1)
Basophils Absolute: 0 10*3/uL (ref 0.0–0.1)
Eosinophils %: 2 % (ref 0–7)
Eosinophils Absolute: 0.1 10*3/uL (ref 0.0–0.4)
Granulocyte Absolute Count: 0 10*3/uL (ref 0.00–0.04)
Hematocrit: 46.7 % (ref 35.0–47.0)
Hemoglobin: 15.6 g/dL (ref 11.5–16.0)
Immature Granulocytes: 0 % (ref 0–0.5)
Lymphocytes %: 43 % (ref 12–49)
Lymphocytes Absolute: 3.4 10*3/uL (ref 0.8–3.5)
MCH: 33.3 PG (ref 26.0–34.0)
MCHC: 33.4 g/dL (ref 30.0–36.5)
MCV: 99.8 FL — ABNORMAL HIGH (ref 80.0–99.0)
MPV: 10.9 FL (ref 8.9–12.9)
Monocytes %: 6 % (ref 5–13)
Monocytes Absolute: 0.5 10*3/uL (ref 0.0–1.0)
NRBC Absolute: 0 10*3/uL (ref 0.00–0.01)
Neutrophils %: 48 % (ref 32–75)
Neutrophils Absolute: 3.8 10*3/uL (ref 1.8–8.0)
Nucleated RBCs: 0 PER 100 WBC
Platelets: 266 10*3/uL (ref 150–400)
RBC: 4.68 M/uL (ref 3.80–5.20)
RDW: 11.9 % (ref 11.5–14.5)
WBC: 7.9 10*3/uL (ref 3.6–11.0)

## 2020-07-13 MED ORDER — MORPHINE 4 MG/ML INTRAVENOUS SOLUTION
4 mg/mL | Freq: Once | INTRAVENOUS | Status: AC
Start: 2020-07-13 — End: 2020-07-13
  Administered 2020-07-13: 23:00:00 via INTRAVENOUS

## 2020-07-13 MED ORDER — ONDANSETRON (PF) 4 MG/2 ML INJECTION
4 mg/2 mL | INTRAMUSCULAR | Status: AC
Start: 2020-07-13 — End: 2020-07-13
  Administered 2020-07-13: 23:00:00 via INTRAVENOUS

## 2020-07-13 MED ORDER — SODIUM CHLORIDE 0.9% BOLUS IV
0.9 % | Freq: Once | INTRAVENOUS | Status: AC
Start: 2020-07-13 — End: 2020-07-14
  Administered 2020-07-13: 23:00:00 via INTRAVENOUS

## 2020-07-13 MED FILL — ONDANSETRON (PF) 4 MG/2 ML INJECTION: 4 mg/2 mL | INTRAMUSCULAR | Qty: 2

## 2020-07-13 MED FILL — MORPHINE 4 MG/ML SYRINGE: 4 mg/mL | INTRAMUSCULAR | Qty: 1

## 2020-07-13 MED FILL — SODIUM CHLORIDE 0.9 % IV: INTRAVENOUS | Qty: 1000

## 2020-07-13 NOTE — ED Notes (Signed)
Pt arrives with abdominal pain that began Friday. Reports being recently diagnosed with appendicitis at [place] [facility] and sent home with antibiotics.rates pain 8/10

## 2020-07-13 NOTE — ED Provider Notes (Signed)
ED Provider Notes by Geraldine Solar, PA at 07/13/20 1456                Author: Geraldine Solar, PA  Service: Emergency Medicine  Author Type: Physician Assistant       Filed: 07/13/20 2024  Date of Service: 07/13/20 1456  Status: Attested           Editor: Geraldine Solar, PA (Physician Assistant)  Cosigner: Ranelle Oyster, MD at 07/14/20 0008          Attestation signed by Ranelle Oyster, MD at 07/14/20 0008          I was personally available for consultation in the emergency department. The APP did not have questions for me or request to have me see the patient. I have  reviewed the chart and agree with the documentation as recorded by the APP, including the assessment, treatment plan, and disposition.      Ranelle Oyster, MD                                    EMERGENCY DEPARTMENT HISTORY AND PHYSICAL EXAM           Date: 07/13/2020   Patient Name: Alexandra Wang        History of Presenting Illness          Chief Complaint       Patient presents with        ?  Abdominal Pain           History Provided By: Patient      HPI: Alexandra Wang,  50 y.o. female with no significant PMHx who presents to the ED with cc of gradually worsening,  waxing/waning, sharp right lower quadrant abdominal pain for 3 days.  Associate symptoms include nausea, vomiting, and decreased appetite.  Symptoms exacerbated with movement and palpation.  No alleviating factors.  Recently went to [facility] ED, diagnosed  with appendicitis.  States general surgery saw her in the ED and discharged her home with amoxicillin and hydrocodone.  Patient states she has been taking hydrocodone intermittently which helps the pain.  The pain is getting worse which is worrying her.   She does not want to take any more pain medication because "I usually do not take a lot of pain medication". Last ate last night. Patient denies fever, chills, chest pain, shortness of breath, diarrhea, urinary symptoms      There are no other complaints, changes, or  physical findings at this time.      PCP: None        No current facility-administered medications on file prior to encounter.          No current outpatient medications on file prior to encounter.             Past History        Past Medical History:   History reviewed. No pertinent past medical history.      Past Surgical History:   History reviewed. No pertinent surgical history.      Family History:   History reviewed. No pertinent family history.      Social History:     Social History          Tobacco Use         ?  Smoking status:  Never Smoker     ?  Smokeless tobacco:  Not  on file       Substance Use Topics         ?  Alcohol use:  Not on file         ?  Drug use:  Not on file           Allergies:     Allergies        Allergen  Reactions         ?  Levaquin [Levofloxacin]  Hives                Review of Systems        Review of Systems    Constitutional: Positive for appetite change (decreased) . Negative for chills, fatigue and fever.    HENT: Negative.     Respiratory: Negative for cough, chest tightness, shortness of breath and wheezing.     Cardiovascular: Negative for chest pain and palpitations.    Gastrointestinal: Positive for abdominal pain (RLQ) , nausea and vomiting.  Negative for diarrhea.    Genitourinary: Negative for frequency and urgency.    Musculoskeletal: Negative for back pain, neck pain and neck stiffness.    Skin: Negative for rash.    Neurological: Negative for dizziness, weakness, light-headedness and headaches.    Psychiatric/Behavioral: Negative.     All other systems reviewed and are negative.           Physical Exam        Physical Exam   Vitals and nursing note reviewed.   Constitutional:        General: She is not in acute distress.     Appearance: Normal appearance. She is well-developed. She is obese. She is not ill-appearing,  toxic-appearing or diaphoretic.   HENT :       Head: Normocephalic and atraumatic.      Nose: Nose normal. No congestion or rhinorrhea.       Mouth/Throat:      Mouth: Mucous membranes are moist.      Pharynx: Oropharynx is clear. No oropharyngeal exudate or posterior oropharyngeal  erythema.   Eyes :       General: No scleral icterus.     Conjunctiva/sclera: Conjunctivae normal.      Pupils: Pupils are equal, round, and reactive to light.   Cardiovascular :       Rate and Rhythm: Normal rate and regular rhythm.      Pulses:            Radial pulses are 2+ on the right side and 2+  on the left side.         Dorsalis pedis pulses are 2+ on the right side and 2+  on the left side.      Heart sounds: No murmur heard.   No friction rub. No gallop.     Pulmonary:       Effort: Pulmonary effort is normal. No tachypnea, accessory muscle usage, respiratory distress or retractions.      Breath sounds: Normal breath sounds . No stridor. No decreased breath sounds, wheezing, rhonchi or rales.    Chest:       Chest wall: No tenderness.   Abdominal :      General: Bowel sounds are normal. There is no distension.      Palpations: Abdomen is soft. There is no mass.      Tenderness: There is abdominal tenderness  in the right lower quadrant. There is guarding . There is no right CVA tenderness,  left CVA tenderness or rebound. Negative signs include Rovsing's sign.     Musculoskeletal:          General: No deformity. Normal range of motion.      Cervical back: Normal range of motion and neck supple. No rigidity. No muscular tenderness.      Right lower leg: No edema.      Left lower leg: No edema.    Skin:      General: Skin is warm and dry.      Capillary Refill: Capillary refill takes less than 2 seconds.      Coloration: Skin is not jaundiced or pale.      Findings: No bruising, erythema or rash.   Neurological:       General: No focal deficit present.      Mental Status: She is alert and oriented to person, place, and time. Mental status is at baseline.      Sensory: Sensation is intact.      Motor: Motor function is intact.   Psychiatric:         Mood and Affect:  Mood normal.          Behavior: Behavior normal. Behavior is cooperative.         Thought Content: Thought content normal.         Judgment: Judgment normal.               Lab and Diagnostic Study Results        Labs -     Recent Results (from the past 24 hour(s))     URINALYSIS W/MICROSCOPIC          Collection Time: 07/13/20  2:57 PM         Result  Value  Ref Range            Color  Yellow/Straw          Appearance  Clear  Clear         Specific gravity  1.008  1.003 - 1.030         pH (UA)  6.0  5.0 - 8.0              Protein  Negative  Negative mg/dL            Glucose  Negative  Negative mg/dL       Ketone  Negative  Negative mg/dL       Bilirubin  Negative  Negative         Blood  Small (A)  Negative         Urobilinogen  0.1  0.1 - 1.0 EU/dL       Nitrites  Negative  Negative         Leukocyte Esterase  Negative  Negative         WBC  0-4  0 - 4 /hpf       RBC  0-5  0 - 5 /hpf       Bacteria  Negative  Negative /hpf       CBC WITH AUTOMATED DIFF          Collection Time: 07/13/20  3:46 PM         Result  Value  Ref Range            WBC  7.9  3.6 - 11.0 K/uL       RBC  4.68  3.80 - 5.20 M/uL  HGB  15.6  11.5 - 16.0 g/dL       HCT  46.7  35.0 - 47.0 %       MCV  99.8 (H)  80.0 - 99.0 FL       MCH  33.3  26.0 - 34.0 PG       MCHC  33.4  30.0 - 36.5 g/dL       RDW  11.9  11.5 - 14.5 %       PLATELET  266  150 - 400 K/uL       MPV  10.9  8.9 - 12.9 FL       NRBC  0.0  0.0 PER 100 WBC       ABSOLUTE NRBC  0.00  0.00 - 0.01 K/uL       NEUTROPHILS  48  32 - 75 %       LYMPHOCYTES  43  12 - 49 %       MONOCYTES  6  5 - 13 %       EOSINOPHILS  2  0 - 7 %       BASOPHILS  1  0 - 1 %       IMMATURE GRANULOCYTES  0  0 - 0.5 %       ABS. NEUTROPHILS  3.8  1.8 - 8.0 K/UL       ABS. LYMPHOCYTES  3.4  0.8 - 3.5 K/UL       ABS. MONOCYTES  0.5  0.0 - 1.0 K/UL       ABS. EOSINOPHILS  0.1  0.0 - 0.4 K/UL       ABS. BASOPHILS  0.0  0.0 - 0.1 K/UL            ABS. IMM. GRANS.  0.0  0.00 - 0.04 K/UL            DF  AUTOMATED           METABOLIC PANEL, COMPREHENSIVE          Collection Time: 07/13/20  3:46 PM         Result  Value  Ref Range            Sodium  138  136 - 145 mmol/L       Potassium  Hemolyzed, recollect requested  3.5 - 5.1 mmol/L       Chloride  110 (H)  97 - 108 mmol/L       CO2  22  21 - 32 mmol/L       Anion gap  6  5 - 15 mmol/L       Glucose  91  65 - 100 mg/dL       BUN  6  6 - 20 mg/dL       Creatinine  0.72  0.55 - 1.02 mg/dL       BUN/Creatinine ratio  8 (L)  12 - 20         GFR est AA  >60  >60 ml/min/1.22m       GFR est non-AA  >60  >60 ml/min/1.768m      Calcium  9.5  8.5 - 10.1 mg/dL       Bilirubin, total  0.6  0.2 - 1.0 mg/dL       AST (SGOT)  Hemolyzed, recollect requested  15 - 37 U/L       ALT (SGPT)  64  12 - 78 U/L  Alk. phosphatase  87  45 - 117 U/L       Protein, total  8.1  6.4 - 8.2 g/dL       Albumin  3.9  3.5 - 5.0 g/dL       Globulin  4.2 (H)  2.0 - 4.0 g/dL       A-G Ratio  0.9 (L)  1.1 - 2.2         LIPASE          Collection Time: 07/13/20  3:46 PM         Result  Value  Ref Range            Lipase  108  73 - 393 U/L       HCG QL SERUM          Collection Time: 07/13/20  3:46 PM         Result  Value  Ref Range            HCG, Ql.  Negative  Negative             Radiologic Studies -      CT Results   (Last 48 hours)                                    07/13/20 1916    CT ABD PELV W CONT  Final result            Impression:    Findings consistent with mild or early acute appendicitis. No      perforation or abscess.                           Dose reduction: All CT scans at this facility are performed using radiation dose      reduction optimization techniques as appropriate to a performed exam, including      the following: Automated exposure control (AEC), adjustment of the MAA and/or      KUB according to the patient's size, or the patient's use of iterative      reconstruction techniques (ASIR).                       Narrative:    CT examination of the abdomen and pelvis is performed with  IV contrast. The exam      is a volume acquisition from which axial, sagittal and coronal images are      generated. Comparison are not available.             Findings:Lung bases are notable for a 3 mm nodule in the lateral segment of the      right middle lobe. The solid abdominal organs reveal several tiny hypodensities      in the liver that are too small for accurate characterization. They are likely      cysts or bile duct hamartomas. There is no free intraperitoneal fluid or gas.      The bowel is unopacified but normal in caliber.             The appendix is abnormal. The distal appendix is mildly dilated, demonstrates a      thickened wall with enhancement and surrounding soft tissue stranding consistent      with inflammatory change. No extraluminal fluid or gas is identified to suggest  perforation or abscess. There is mild adjacent adenopathy as well.             No fluid or mass is seen in the cul-de-sac. The uterus is absent. The right      ovary is normal. The left ovary is not identified with certainty. Bone windows      are unremarkable.                                       Medical Decision Making     - I am the first provider for this patient.      - I reviewed the vital signs, available nursing notes, past medical history, past surgical history, family history and social history.      - Initial assessment performed. The patients presenting problems have been discussed, and they are in agreement with the care plan formulated and outlined with them.  I have encouraged them to ask questions as they arise throughout their visit.      Vital Signs-Reviewed the patient's vital signs.   Patient Vitals for the past 24 hrs:            Temp  Pulse  Resp  BP  SpO2            07/13/20 1800  --  --  --  --  99 %            07/13/20 1451  98.2 ??F (36.8 ??C)  88  18  (!) 139/92  99 %           Records Reviewed: Nursing Notes and Old Medical Records      The patient presents with RLQ abd pain with a  differential diagnosis of appendicitis, ruptured appendicitis, UTI, pyelonephritis, uretal stone, dehydration, electrolyte imbalance         ED Course:         ED Course as of 07/13/20 2023       [facility] Jul 13, 2020        2020  CONSULT NOTE:   Consultant: Dr. Christene Slates   Specialty: General Surgery   Discussed pt's history, disposition, and available diagnostic and imaging results. Reviewed care plans. Consultant will admit the patient for appendectomy tomorrow.   Geraldine Solar, PA       [NO]              ED Course User Index   [NO] Geraldine Solar, PA              Provider Notes (Medical Decision Making):       MDM   Number of Diagnoses or Management Options   Acute appendicitis, unspecified acute appendicitis type   Diagnosis management comments:       50 year old female with right upper quadrant pain, recent diagnosis of appendicitis and sent home on p.o. antibiotics.  CBC without leukocytosis.  CMP without any acute findings.  Lipase negative.  UPT negative.  CT scan showing early appendicitis.  Given  this is patient's second visit and pain is worsening, consulted with general surgery who will admit patient for appendectomy. Patient afebrile, nontoxic appearing. Zosyn ordered. Pain controlled.          Amount and/or Complexity of Data Reviewed   Clinical lab tests: ordered and reviewed   Tests in the radiology section of CPT??: ordered and reviewed   Review and summarize past  medical records: yes   Discuss the patient with other providers: yes      Patient Progress   Patient progress: stable                  Disposition     Disposition: Admit to general surgery- Dr. Deters        Diagnosis        Clinical Impression:       1.  Acute appendicitis, unspecified acute appendicitis type            Attestations:      Geraldine Solar, PA      Please note that this dictation was completed with Dragon, the computer voice recognition software.  Quite often unanticipated grammatical, syntax, homophones, and other interpretive  errors are inadvertently  transcribed by the computer software.  Please disregard these errors.  Please excuse any errors that have escaped final proofreading.  Thank you.

## 2020-07-14 LAB — TYPE & SCREEN
ABO/Rh(D): O POS
Antibody screen: NEGATIVE

## 2020-07-14 LAB — METABOLIC PANEL, BASIC
Anion gap: 9 mmol/L (ref 5–15)
BUN/Creatinine ratio: 8 — ABNORMAL LOW (ref 12–20)
BUN: 6 mg/dL (ref 6–20)
CO2: 23 mmol/L (ref 21–32)
Calcium: 8.8 mg/dL (ref 8.5–10.1)
Chloride: 110 mmol/L — ABNORMAL HIGH (ref 97–108)
Creatinine: 0.77 mg/dL (ref 0.55–1.02)
GFR est AA: >60 mL/min/{1.73_m2} (ref >60)
GFR est non-AA: >60 mL/min/{1.73_m2} (ref >60)
Glucose: 92 mg/dL (ref 65–100)
Potassium: 3.6 mmol/L (ref 3.5–5.1)
Sodium: 142 mmol/L (ref 136–145)

## 2020-07-14 LAB — CBC W/O DIFF
ABSOLUTE NRBC: 0 10*3/uL (ref 0.00–0.01)
HCT: 38.9 % (ref 35.0–47.0)
HGB: 12.9 g/dL (ref 11.5–16.0)
MCH: 33.1 PG (ref 26.0–34.0)
MCHC: 33.2 g/dL (ref 30.0–36.5)
MCV: 99.7 FL — ABNORMAL HIGH (ref 80.0–99.0)
MPV: 9.7 FL (ref 8.9–12.9)
NRBC: 0 PER 100 WBC
PLATELET: 288 10*3/uL (ref 150–400)
RBC: 3.9 M/uL (ref 3.80–5.20)
RDW: 11.9 % (ref 11.5–14.5)
WBC: 8.2 10*3/uL (ref 3.6–11.0)

## 2020-07-14 LAB — MAGNESIUM
Magnesium: 2 mg/dL (ref 1.6–2.4)
Magnesium: 2 mg/dL (ref 1.6–2.4)

## 2020-07-14 LAB — COVID-19 RAPID TEST: COVID-19 rapid test: NOT DETECTED

## 2020-07-14 LAB — MRSA SCREEN - PCR (NASAL)
MRSA By PCR (Nasal): NOT DETECTED
MRSA by PCR, Nasal: NOT DETECTED

## 2020-07-14 LAB — BASIC METABOLIC PANEL
Anion Gap: 9 mmol/L (ref 5–15)
BUN: 6 mg/dL (ref 6–20)
Bun/Cre Ratio: 8 — ABNORMAL LOW (ref 12–20)
CO2: 23 mmol/L (ref 21–32)
Calcium: 8.8 mg/dL (ref 8.5–10.1)
Chloride: 110 mmol/L — ABNORMAL HIGH (ref 97–108)
Creatinine: 0.77 mg/dL (ref 0.55–1.02)
EGFR IF NonAfrican American: >60 mL/min/{1.73_m2} (ref >60)
GFR African American: >60 mL/min/{1.73_m2} (ref >60)
Glucose: 92 mg/dL (ref 65–100)
Potassium: 3.6 mmol/L (ref 3.5–5.1)
Sodium: 142 mmol/L (ref 136–145)

## 2020-07-14 LAB — CBC
Hematocrit: 38.9 % (ref 35.0–47.0)
Hemoglobin: 12.9 g/dL (ref 11.5–16.0)
MCH: 33.1 PG (ref 26.0–34.0)
MCHC: 33.2 g/dL (ref 30.0–36.5)
MCV: 99.7 FL — ABNORMAL HIGH (ref 80.0–99.0)
MPV: 9.7 FL (ref 8.9–12.9)
NRBC Absolute: 0 10*3/uL (ref 0.00–0.01)
Nucleated RBCs: 0 PER 100 WBC
Platelets: 288 10*3/uL (ref 150–400)
RBC: 3.9 M/uL (ref 3.80–5.20)
RDW: 11.9 % (ref 11.5–14.5)
WBC: 8.2 10*3/uL (ref 3.6–11.0)

## 2020-07-14 LAB — COVID-19, RAPID: SARS-CoV-2, Rapid: NOT DETECTED

## 2020-07-14 LAB — TYPE AND SCREEN
ABO/Rh: O POS
Antibody Screen: NEGATIVE

## 2020-07-14 MED ORDER — ACETAMINOPHEN 650 MG RECTAL SUPPOSITORY
650 mg | Freq: Four times a day (QID) | RECTAL | Status: DC | PRN
Start: 2020-07-14 — End: 2020-07-15

## 2020-07-14 MED ORDER — SUCCINYLCHOLINE CHLORIDE 200 MG/10 ML (20 MG/ML) INTRAVENOUS SYRINGE
200 mg/10 mL (20 mg/mL) | INTRAVENOUS | Status: AC
Start: 2020-07-14 — End: ?

## 2020-07-14 MED ORDER — FENTANYL CITRATE (PF) 50 MCG/ML IJ SOLN
50 mcg/mL | INTRAMUSCULAR | Status: AC
Start: 2020-07-14 — End: 2020-07-15

## 2020-07-14 MED ORDER — LIDOCAINE (PF) 20 MG/ML (2 %) IJ SOLN
20 mg/mL (2 %) | INTRAMUSCULAR | Status: AC
Start: 2020-07-14 — End: ?

## 2020-07-14 MED ORDER — BUPIVACAINE-EPINEPHRINE 0.25 %-1:200,000 IJ SOLN
0.25 %-1:200,000 | INTRAMUSCULAR | Status: AC
Start: 2020-07-14 — End: ?

## 2020-07-14 MED ORDER — DEXAMETHASONE SODIUM PHOSPHATE 4 MG/ML IJ SOLN
4 mg/mL | INTRAMUSCULAR | Status: AC
Start: 2020-07-14 — End: ?

## 2020-07-14 MED ORDER — SODIUM CHLORIDE 0.9 % IJ SYRG
INTRAMUSCULAR | Status: DC | PRN
Start: 2020-07-14 — End: 2020-07-15

## 2020-07-14 MED ORDER — ACETAMINOPHEN 325 MG TABLET
325 mg | Freq: Four times a day (QID) | ORAL | Status: DC | PRN
Start: 2020-07-14 — End: 2020-07-15
  Administered 2020-07-14: 03:00:00 via ORAL

## 2020-07-14 MED ORDER — MORPHINE 4 MG/ML INTRAVENOUS SOLUTION
4 mg/mL | INTRAVENOUS | Status: DC | PRN
Start: 2020-07-14 — End: 2020-07-15
  Administered 2020-07-14: 23:00:00 via INTRAVENOUS

## 2020-07-14 MED ORDER — OXYCODONE 5 MG TAB
5 mg | Freq: Four times a day (QID) | ORAL | Status: DC | PRN
Start: 2020-07-14 — End: 2020-07-15
  Administered 2020-07-15: 03:00:00 via ORAL

## 2020-07-14 MED ORDER — SUGAMMADEX 100 MG/ML INTRAVENOUS SOLUTION
100 mg/mL | INTRAVENOUS | Status: DC | PRN
Start: 2020-07-14 — End: 2020-07-14
  Administered 2020-07-14: 22:00:00 via INTRAVENOUS

## 2020-07-14 MED ORDER — FENTANYL CITRATE (PF) 50 MCG/ML IJ SOLN
50 mcg/mL | INTRAMUSCULAR | Status: DC | PRN
Start: 2020-07-14 — End: 2020-07-14
  Administered 2020-07-14 (×2): via INTRAVENOUS

## 2020-07-14 MED ORDER — ONDANSETRON 4 MG TAB, RAPID DISSOLVE
4 mg | Freq: Three times a day (TID) | ORAL | Status: DC | PRN
Start: 2020-07-14 — End: 2020-07-14

## 2020-07-14 MED ORDER — DEXAMETHASONE SODIUM PHOSPHATE 4 MG/ML IJ SOLN
4 mg/mL | INTRAMUSCULAR | Status: DC | PRN
Start: 2020-07-14 — End: 2020-07-14
  Administered 2020-07-14: 21:00:00 via INTRAVENOUS

## 2020-07-14 MED ORDER — SODIUM CHLORIDE 0.9 % IJ SYRG
Freq: Three times a day (TID) | INTRAMUSCULAR | Status: DC
Start: 2020-07-14 — End: 2020-07-15
  Administered 2020-07-14 – 2020-07-15 (×4): via INTRAVENOUS

## 2020-07-14 MED ORDER — ONDANSETRON 4 MG TAB, RAPID DISSOLVE
4 mg | Freq: Three times a day (TID) | ORAL | Status: DC | PRN
Start: 2020-07-14 — End: 2020-07-15

## 2020-07-14 MED ORDER — SODIUM CHLORIDE 0.9 % IJ SYRG
INTRAMUSCULAR | Status: DC | PRN
Start: 2020-07-14 — End: 2020-07-14

## 2020-07-14 MED ORDER — FENTANYL CITRATE (PF) 50 MCG/ML IJ SOLN
50 mcg/mL | INTRAMUSCULAR | Status: DC | PRN
Start: 2020-07-14 — End: 2020-07-14
  Administered 2020-07-14 (×2): via INTRAVENOUS

## 2020-07-14 MED ORDER — ONDANSETRON (PF) 4 MG/2 ML INJECTION
4 mg/2 mL | Freq: Four times a day (QID) | INTRAMUSCULAR | Status: DC | PRN
Start: 2020-07-14 — End: 2020-07-15

## 2020-07-14 MED ORDER — ONDANSETRON (PF) 4 MG/2 ML INJECTION
4 mg/2 mL | INTRAMUSCULAR | Status: AC
Start: 2020-07-14 — End: ?

## 2020-07-14 MED ORDER — PROPOFOL 10 MG/ML IV EMUL
10 mg/mL | INTRAVENOUS | Status: DC | PRN
Start: 2020-07-14 — End: 2020-07-14
  Administered 2020-07-14: 21:00:00 via INTRAVENOUS

## 2020-07-14 MED ORDER — EPHEDRINE SULFATE 50 MG/ML INTRAVENOUS SOLUTION
50 mg/mL | INTRAVENOUS | Status: DC | PRN
Start: 2020-07-14 — End: 2020-07-14

## 2020-07-14 MED ORDER — LACTATED RINGERS IV
INTRAVENOUS | Status: DC
Start: 2020-07-14 — End: 2020-07-15
  Administered 2020-07-14 – 2020-07-15 (×3): via INTRAVENOUS

## 2020-07-14 MED ORDER — LACTATED RINGERS IV
INTRAVENOUS | Status: DC | PRN
Start: 2020-07-14 — End: 2020-07-14
  Administered 2020-07-14: 21:00:00 via INTRAVENOUS

## 2020-07-14 MED ORDER — PIPERACILLIN-TAZOBACTAM 3.375 GRAM IV SOLR
3.375 gram | INTRAVENOUS | Status: AC
Start: 2020-07-14 — End: 2020-07-14
  Administered 2020-07-14: 02:00:00 via INTRAVENOUS

## 2020-07-14 MED ORDER — PIPERACILLIN-TAZOBACTAM 3.375 GRAM IV SOLR
3.375 gram | INTRAVENOUS | Status: AC
Start: 2020-07-14 — End: ?

## 2020-07-14 MED ORDER — SODIUM CHLORIDE 0.9 % IJ SYRG
Freq: Three times a day (TID) | INTRAMUSCULAR | Status: DC
Start: 2020-07-14 — End: 2020-07-15
  Administered 2020-07-14 – 2020-07-15 (×3): via INTRAVENOUS

## 2020-07-14 MED ORDER — DIPHENHYDRAMINE HCL 50 MG/ML IJ SOLN
50 mg/mL | INTRAMUSCULAR | Status: DC | PRN
Start: 2020-07-14 — End: 2020-07-14

## 2020-07-14 MED ORDER — MORPHINE 4 MG/ML INTRAVENOUS SOLUTION
4 mg/mL | Freq: Once | INTRAVENOUS | Status: AC
Start: 2020-07-14 — End: 2020-07-14
  Administered 2020-07-14: 19:00:00 via INTRAVENOUS

## 2020-07-14 MED ORDER — MEPERIDINE (PF) 25 MG/ML INJ SOLUTION
25 mg/ml | Freq: Once | INTRAMUSCULAR | Status: DC
Start: 2020-07-14 — End: 2020-07-14

## 2020-07-14 MED ORDER — IOPAMIDOL 76 % IV SOLN
370 mg iodine /mL (76 %) | Freq: Once | INTRAVENOUS | Status: AC
Start: 2020-07-14 — End: 2020-07-13
  Administered 2020-07-14: via INTRAVENOUS

## 2020-07-14 MED ORDER — MIDAZOLAM 1 MG/ML IJ SOLN
1 mg/mL | INTRAMUSCULAR | Status: AC
Start: 2020-07-14 — End: ?

## 2020-07-14 MED ORDER — HYDROMORPHONE 0.5 MG/0.5 ML SYRINGE
0.5 mg/ mL | INTRAMUSCULAR | Status: DC | PRN
Start: 2020-07-14 — End: 2020-07-14

## 2020-07-14 MED ORDER — FENTANYL CITRATE (PF) 50 MCG/ML IJ SOLN
50 mcg/mL | INTRAMUSCULAR | Status: AC
Start: 2020-07-14 — End: ?

## 2020-07-14 MED ORDER — POLYETHYLENE GLYCOL 3350 17 GRAM (100 %) ORAL POWDER PACKET
17 gram | Freq: Every day | ORAL | Status: DC | PRN
Start: 2020-07-14 — End: 2020-07-15

## 2020-07-14 MED ORDER — ONDANSETRON (PF) 4 MG/2 ML INJECTION
4 mg/2 mL | INTRAMUSCULAR | Status: DC | PRN
Start: 2020-07-14 — End: 2020-07-14
  Administered 2020-07-14: 21:00:00 via INTRAVENOUS

## 2020-07-14 MED ORDER — PROPOFOL 10 MG/ML IV EMUL
10 mg/mL | INTRAVENOUS | Status: AC
Start: 2020-07-14 — End: ?

## 2020-07-14 MED ORDER — ACETAMINOPHEN 325 MG TABLET
325 mg | Freq: Four times a day (QID) | ORAL | Status: DC
Start: 2020-07-14 — End: 2020-07-15
  Administered 2020-07-15 (×2): via ORAL

## 2020-07-14 MED ORDER — SODIUM CHLORIDE 0.9 % IJ SYRG
Freq: Three times a day (TID) | INTRAMUSCULAR | Status: DC
Start: 2020-07-14 — End: 2020-07-14

## 2020-07-14 MED ORDER — LIDOCAINE (PF) 20 MG/ML (2 %) IJ SOLN
20 mg/mL (2 %) | INTRAMUSCULAR | Status: DC | PRN
Start: 2020-07-14 — End: 2020-07-14
  Administered 2020-07-14: 21:00:00 via INTRAVENOUS

## 2020-07-14 MED ORDER — ROCURONIUM 10 MG/ML IV
10 mg/mL | INTRAVENOUS | Status: AC
Start: 2020-07-14 — End: ?

## 2020-07-14 MED ORDER — BUPIVACAINE-EPINEPHRINE 0.25 %-1:200,000 IJ SOLN
0.25 %-1:200,000 | INTRAMUSCULAR | Status: DC | PRN
Start: 2020-07-14 — End: 2020-07-14
  Administered 2020-07-14: 22:00:00 via PERINEURAL

## 2020-07-14 MED ORDER — SUCCINYLCHOLINE CHLORIDE 20 MG/ML INJECTION
20 mg/mL | INTRAMUSCULAR | Status: DC | PRN
Start: 2020-07-14 — End: 2020-07-14
  Administered 2020-07-14: 21:00:00 via INTRAVENOUS

## 2020-07-14 MED ORDER — ROCURONIUM 10 MG/ML IV
10 mg/mL | INTRAVENOUS | Status: DC | PRN
Start: 2020-07-14 — End: 2020-07-14
  Administered 2020-07-14: 21:00:00 via INTRAVENOUS

## 2020-07-14 MED ORDER — ONDANSETRON (PF) 4 MG/2 ML INJECTION
4 mg/2 mL | Freq: Four times a day (QID) | INTRAMUSCULAR | Status: DC | PRN
Start: 2020-07-14 — End: 2020-07-14

## 2020-07-14 MED ORDER — SODIUM CHLORIDE 0.9 % IV PIGGY BACK
3.375 gram | Freq: Three times a day (TID) | INTRAVENOUS | Status: DC
Start: 2020-07-14 — End: 2020-07-14
  Administered 2020-07-14 (×3): via INTRAVENOUS

## 2020-07-14 MED ORDER — MIDAZOLAM 1 MG/ML IJ SOLN
1 mg/mL | INTRAMUSCULAR | Status: DC | PRN
Start: 2020-07-14 — End: 2020-07-14
  Administered 2020-07-14: 21:00:00 via INTRAVENOUS

## 2020-07-14 MED ORDER — IBUPROFEN 200 MG TAB
200 mg | Freq: Three times a day (TID) | ORAL | Status: DC | PRN
Start: 2020-07-14 — End: 2020-07-15
  Administered 2020-07-15: 08:00:00 via ORAL

## 2020-07-14 MED FILL — DEXAMETHASONE SODIUM PHOSPHATE 4 MG/ML IJ SOLN: 4 mg/mL | INTRAMUSCULAR | Qty: 1

## 2020-07-14 MED FILL — SUCCINYLCHOLINE CHLORIDE 200 MG/10 ML (20 MG/ML) INTRAVENOUS SYRINGE: 200 mg/10 mL (20 mg/mL) | INTRAVENOUS | Qty: 10

## 2020-07-14 MED FILL — SODIUM CHLORIDE 0.9 % IJ SYRG: INTRAMUSCULAR | Qty: 40

## 2020-07-14 MED FILL — TYLENOL 325 MG TABLET: 325 mg | ORAL | Qty: 2

## 2020-07-14 MED FILL — LACTATED RINGERS IV: INTRAVENOUS | Qty: 1000

## 2020-07-14 MED FILL — PIPERACILLIN-TAZOBACTAM 3.375 GRAM IV SOLR: 3.375 gram | INTRAVENOUS | Qty: 3.38

## 2020-07-14 MED FILL — MIDAZOLAM 1 MG/ML IJ SOLN: 1 mg/mL | INTRAMUSCULAR | Qty: 2

## 2020-07-14 MED FILL — PROPOFOL 10 MG/ML IV EMUL: 10 mg/mL | INTRAVENOUS | Qty: 20

## 2020-07-14 MED FILL — ONDANSETRON (PF) 4 MG/2 ML INJECTION: 4 mg/2 mL | INTRAMUSCULAR | Qty: 2

## 2020-07-14 MED FILL — ROCURONIUM 10 MG/ML IV: 10 mg/mL | INTRAVENOUS | Qty: 5

## 2020-07-14 MED FILL — MORPHINE 4 MG/ML SYRINGE: 4 mg/mL | INTRAMUSCULAR | Qty: 1

## 2020-07-14 MED FILL — FENTANYL CITRATE (PF) 50 MCG/ML IJ SOLN: 50 mcg/mL | INTRAMUSCULAR | Qty: 2

## 2020-07-14 MED FILL — XYLOCAINE-MPF 20 MG/ML (2 %) INJECTION SOLUTION: 20 mg/mL (2 %) | INTRAMUSCULAR | Qty: 5

## 2020-07-14 MED FILL — ISOVUE-370  76 % INTRAVENOUS SOLUTION: 370 mg iodine /mL (76 %) | INTRAVENOUS | Qty: 100

## 2020-07-14 MED FILL — SENSORCAINE-EPINEPHRINE 0.25 %-1:200,000 INJECTION SOLUTION: 0.25 %-1:200,000 | INTRAMUSCULAR | Qty: 50

## 2020-07-14 NOTE — Anesthesia Pre-Procedure Evaluation (Signed)
Relevant Problems   No relevant active problems       Anesthetic History   No history of anesthetic complications            Review of Systems / Medical History  Patient summary reviewed, nursing notes reviewed and pertinent labs reviewed    Pulmonary          Smoker      Comments: SNORING   Neuro/Psych   Within defined limits           Cardiovascular  Within defined limits                Exercise tolerance: >4 METS     GI/Hepatic/Renal               Comments: ACUTE APPENDICITIS.  Nausea.  PAIN ABDOMEN. Endo/Other        Obesity     Other Findings            Physical Exam    Airway  Mallampati: I  TM Distance: 4 - 6 cm  Neck ROM: normal range of motion   Mouth opening: Normal     Cardiovascular    Rhythm: regular  Rate: normal      Pertinent negatives: No murmur   Dental    Dentition: Lower dentition intact and Upper dentition intact     Pulmonary  Breath sounds clear to auscultation               Abdominal  GI exam deferred       Other Findings            Anesthetic Plan    ASA: 2, emergent  Anesthesia type: general          Induction: RSI  Anesthetic plan and risks discussed with: Patient

## 2020-07-14 NOTE — Anesthesia Post-Procedure Evaluation (Signed)
Procedure(s):  APPENDECTOMY LAPAROSCOPIC.    general    Anesthesia Post Evaluation        Patient location during evaluation: PACU  Patient participation: complete - patient participated  Level of consciousness: awake  Pain score: 0  Pain management: adequate  Airway patency: patent  Anesthetic complications: no  Cardiovascular status: acceptable  Respiratory status: acceptable  Hydration status: acceptable  Post anesthesia nausea and vomiting:  controlled  Final Post Anesthesia Temperature Assessment:  Normothermia (36.0-37.5 degrees C)      INITIAL Post-op Vital signs:   Vitals Value Taken Time   BP 119/80 07/14/20 1655   Temp 36.1 ??C (97 ??F) 07/14/20 1655   Pulse 52 07/14/20 1655   Resp 22 07/14/20 1655   SpO2 100 % 07/14/20 1655

## 2020-07-14 NOTE — Progress Notes (Signed)
Received per wheelchair accompanied by ED staff. Patient lying on bed, not in distress. Complaints of pain at 5 via pain scale on the lower abdomen. Encouraged on NPO as ordered. Vital signs taken and recorded.

## 2020-07-14 NOTE — Progress Notes (Signed)
Report given to Susie, RN

## 2020-07-14 NOTE — Progress Notes (Signed)
Problem: Pain  Goal: *Control of Pain  Outcome: Progressing Towards Goal     Problem: Patient Education: Go to Patient Education Activity  Goal: Patient/Family Education  Outcome: Progressing Towards Goal

## 2020-07-14 NOTE — Progress Notes (Signed)
Problem: Pain  Goal: *Control of Pain  Outcome: Progressing Towards Goal     Problem: Patient Education: Go to Patient Education Activity  Goal: Patient/Family Education  Outcome: Progressing Towards Goal     Problem: Falls - Risk of  Goal: *Absence of Falls  Description: Document Alexandra Wang Fall Risk and appropriate interventions in the flowsheet.  Outcome: Progressing Towards Goal  Note: Fall Risk Interventions:            Medication Interventions: Bed/chair exit alarm,Patient to call before getting OOB,Teach patient to arise slowly    Elimination Interventions: Bed/chair exit alarm,Call light in reach,Patient to call for help with toileting needs              Problem: Patient Education: Go to Patient Education Activity  Goal: Patient/Family Education  Outcome: Progressing Towards Goal

## 2020-07-14 NOTE — Progress Notes (Signed)
Reason for Admission:  Acute Appendicitis                      RUR Score:  5%                   Plan for utilizing home health:  No plans at this time        PCP: First and Last name:  None     Name of Practice: na   Are you a current patient: Yes/No: na   Approximate date of last visit: na   Can you participate in a virtual visit with your PCP: na                    Current Advanced Directive/Advance Care Plan: Full Code      Healthcare Decision MakerLelon Mast Wang/daughter [phone]    Click here to complete HealthCare Decision Makers including selection of the Healthcare Decision Maker Relationship (ie "Primary")                             Transition of Care Plan:                      Met with patient at bedside. Patient lives in 2 story house with her daughter. She does not use DME at this time and attends to her own ADLs. Patient has no issues with transportation currently. Daughter can pick up for discharge. No needs identified currently.     DC plan home self care.

## 2020-07-14 NOTE — Op Note (Signed)
Brief Postoperative Note    Patient: [name]  Date of Birth: 10/24/1970  MRN: [id number]    Date of Procedure: 07/14/2020     Pre-Op Diagnosis: Acute appendicitis, unspecified acute appendicitis type [K35.80]    Post-Op Diagnosis: Same as preoperative diagnosis.      Procedure(s):  APPENDECTOMY LAPAROSCOPIC    Surgeon(s):  Erryn Dickison, Susette Racer, DO    Surgical Assistant: Surg Asst-1: Wendi Snipes    Anesthesia: General     Estimated Blood Loss (mL): Minimal    Complications: None    Specimens:   ID Type Source Tests Collected by Time Destination   1 : Appendix Preservative Appendix  Hjalmer Iovino, Susette Racer, DO 07/14/2020 1616 Pathology        Implants: * No implants in log *    Drains: * No LDAs found *    Findings: acute appendicitis    Electronically Signed by Susette Racer Leiby Pigeon, DO on 07/14/2020 at 4:27 PM

## 2020-07-14 NOTE — ED Notes (Signed)
 TRANSFER - OUT REPORT:    Verbal report given to [name](name) on Alexandra Wang  being transferred to [facility](unit) for routine progression of care       Report consisted of patient's Situation, Background, Assessment and   Recommendations(SBAR).     Information from the following report(s) SBAR, ED Summary, [facility] and Recent Results was reviewed with the receiving nurse.    Lines:   Peripheral IV 07/13/20 Anterior;Proximal;Right Forearm (Active)        Opportunity for questions and clarification was provided.      Patient transported with:   [employer]

## 2020-07-14 NOTE — H&P (Signed)
General Surgery H&P    Patient: Alexandra Wang MRN: [id number]  SSN: [id number]    Date of Birth: 24-Sep-1970  Age: 50 y.o.  Sex: female      Subjective:      Alexandra Wang is a 50 y.o. female who is being seen for persistent worsening right lower quadrant and periumbilical pain x 2 days. Two days ago, patient was seen at [facility] ED where she was diagnosed with appendicitis and discharged home with amoxicillin and hydrocodone. Since then, her pain has been worsening, especially with movement and palpation. Also reports nausea and decreased appetite. No fevers, chills, vomiting, changes in bowel or bladder habits. No chest pain or shortness of breath. On 07/14/2019, presented to ED. Workup showed showed mild/early acute appendicitis without perforation or abscess on CT abdomen.    History reviewed. No pertinent past medical history.    Past Surgical History  ?? Total laparoscopic hysterectomy     History reviewed. No pertinent family history.     Social History     Tobacco Use   ??? Smoking status: Never Smoker   ??? Smokeless tobacco: Not on file   Substance Use Topics   ??? Alcohol use: Not on file      Current Facility-Administered Medications   Medication Dose Route Frequency Provider Last Rate Last Admin   ??? sodium chloride (NS) flush 5-40 mL  5-40 mL IntraVENous Q8H Alnita Aybar V, DO   10 mL at 07/14/20 3664   ??? sodium chloride (NS) flush 5-40 mL  5-40 mL IntraVENous PRN Tarin Johndrow V, DO       ??? acetaminophen (TYLENOL) tablet 650 mg  650 mg Oral Q6H PRN Jaimie Pippins V, DO   650 mg at 07/13/20 2228    Or   ??? acetaminophen (TYLENOL) suppository 650 mg  650 mg Rectal Q6H PRN Dia Donate V, DO       ??? polyethylene glycol (MIRALAX) packet 17 g  17 g Oral DAILY PRN Albaraa Swingle V, DO       ??? ondansetron (ZOFRAN ODT) tablet 4 mg  4 mg Oral Q8H PRN Janele Lague V, DO        Or   ??? ondansetron (ZOFRAN) injection 4 mg  4 mg IntraVENous Q6H PRN Christan Defranco V, DO       ??? lactated Ringers infusion  125  mL/hr IntraVENous CONTINUOUS Omolara Carol V, DO 125 mL/hr at 07/13/20 2340 125 mL/hr at 07/13/20 2340   ??? piperacillin-tazobactam (ZOSYN) 3.375 g in 0.9% sodium chloride (MBP/ADV) 100 mL MBP  3.375 g IntraVENous Q8H Bonniejean Piano V, DO 25 mL/hr at 07/14/20 0307 3.375 g at 07/14/20 4034        Allergies   Allergen Reactions   ??? Levaquin [Levofloxacin] Hives       Review of Systems:  Review of Systems   Constitutional: Negative for chills, diaphoresis and fever.   HENT: Negative for congestion, ear pain, sinus pain and sore throat.    Eyes: Negative for blurred vision, double vision and photophobia.   Respiratory: Negative for cough, shortness of breath and wheezing.    Cardiovascular: Negative for chest pain, palpitations, orthopnea and leg swelling.   Gastrointestinal: Positive for abdominal pain (RLQ and periumbilical) and nausea. Negative for blood in stool, constipation, diarrhea, heartburn, melena and vomiting.   Genitourinary: Negative for dysuria, frequency and urgency.   Musculoskeletal: Negative for back pain, joint pain and myalgias.   Skin: Negative for itching and rash.   Neurological:  Negative for dizziness, weakness and headaches.        Objective:     Vitals:    07/13/20 2339 07/14/20 0006 07/14/20 0231 07/14/20 0749   BP: 95/63 99/79 119/71 103/65   Pulse: 63 64 60 66   Resp:   18 18   Temp:   97.6 ??F (36.4 ??C) 98.3 ??F (36.8 ??C)   SpO2: 99% 98% 97% 99%   Weight:       Height:            Physical Exam:  Physical Exam  Constitutional:       General: She is not in acute distress.     Appearance: She is normal weight.   HENT:      Head: Normocephalic and atraumatic.      Right Ear: External ear normal.      Left Ear: External ear normal.      Nose: Nose normal.      Mouth/Throat:      Mouth: Mucous membranes are moist.      Pharynx: Oropharynx is clear. No oropharyngeal exudate or posterior oropharyngeal erythema.   Eyes:      General: No scleral icterus.     Extraocular Movements: Extraocular  movements intact.      Conjunctiva/sclera: Conjunctivae normal.      Pupils: Pupils are equal, round, and reactive to light.   Cardiovascular:      Rate and Rhythm: Normal rate and regular rhythm.      Heart sounds: Normal heart sounds. No murmur heard.  No friction rub. No gallop.    Pulmonary:      Effort: Pulmonary effort is normal. No respiratory distress.      Breath sounds: Normal breath sounds. No wheezing, rhonchi or rales.   Abdominal:      General: Abdomen is flat. Bowel sounds are normal. There is no distension.      Palpations: Abdomen is soft. There is no mass.      Tenderness: There is abdominal tenderness (periumbilical and RLQ). There is guarding. There is no rebound.      Hernia: No hernia is present.   Musculoskeletal:         General: No swelling, tenderness or deformity. Normal range of motion.      Cervical back: Normal range of motion and neck supple. No rigidity.   Skin:     General: Skin is warm and dry.      Coloration: Skin is not jaundiced or pale.      Findings: No bruising.   Neurological:      General: No focal deficit present.      Mental Status: She is alert and oriented to person, place, and time.      Cranial Nerves: No cranial nerve deficit.      Sensory: No sensory deficit.      Motor: No weakness.          Recent Results (from the past 24 hour(s))   URINALYSIS W/MICROSCOPIC    Collection Time: 07/13/20  2:57 PM   Result Value Ref Range    Color Yellow/Straw      Appearance Clear Clear      Specific gravity 1.008 1.003 - 1.030      pH (UA) 6.0 5.0 - 8.0      Protein Negative Negative mg/dL    Glucose Negative Negative mg/dL    Ketone Negative Negative mg/dL    Bilirubin Negative Negative      Blood  Small (A) Negative      Urobilinogen 0.1 0.1 - 1.0 EU/dL    Nitrites Negative Negative      Leukocyte Esterase Negative Negative      WBC 0-4 0 - 4 /hpf    RBC 0-5 0 - 5 /hpf    Bacteria Negative Negative /hpf   CBC WITH AUTOMATED DIFF    Collection Time: 07/13/20  3:46 PM   Result  Value Ref Range    WBC 7.9 3.6 - 11.0 K/uL    RBC 4.68 3.80 - 5.20 M/uL    HGB 15.6 11.5 - 16.0 g/dL    HCT 46.7 35.0 - 47.0 %    MCV 99.8 (H) 80.0 - 99.0 FL    MCH 33.3 26.0 - 34.0 PG    MCHC 33.4 30.0 - 36.5 g/dL    RDW 11.9 11.5 - 14.5 %    PLATELET 266 150 - 400 K/uL    MPV 10.9 8.9 - 12.9 FL    NRBC 0.0 0.0 PER 100 WBC    ABSOLUTE NRBC 0.00 0.00 - 0.01 K/uL    NEUTROPHILS 48 32 - 75 %    LYMPHOCYTES 43 12 - 49 %    MONOCYTES 6 5 - 13 %    EOSINOPHILS 2 0 - 7 %    BASOPHILS 1 0 - 1 %    IMMATURE GRANULOCYTES 0 0 - 0.5 %    ABS. NEUTROPHILS 3.8 1.8 - 8.0 K/UL    ABS. LYMPHOCYTES 3.4 0.8 - 3.5 K/UL    ABS. MONOCYTES 0.5 0.0 - 1.0 K/UL    ABS. EOSINOPHILS 0.1 0.0 - 0.4 K/UL    ABS. BASOPHILS 0.0 0.0 - 0.1 K/UL    ABS. IMM. GRANS. 0.0 0.00 - 0.04 K/UL    DF AUTOMATED     METABOLIC PANEL, COMPREHENSIVE    Collection Time: 07/13/20  3:46 PM   Result Value Ref Range    Sodium 138 136 - 145 mmol/L    Potassium Hemolyzed, recollect requested 3.5 - 5.1 mmol/L    Chloride 110 (H) 97 - 108 mmol/L    CO2 22 21 - 32 mmol/L    Anion gap 6 5 - 15 mmol/L    Glucose 91 65 - 100 mg/dL    BUN 6 6 - 20 mg/dL    Creatinine 0.72 0.55 - 1.02 mg/dL    BUN/Creatinine ratio 8 (L) 12 - 20      GFR est AA >60 >60 ml/min/1.28m    GFR est non-AA >60 >60 ml/min/1.78m   Calcium 9.5 8.5 - 10.1 mg/dL    Bilirubin, total 0.6 0.2 - 1.0 mg/dL    AST (SGOT) Hemolyzed, recollect requested 15 - 37 U/L    ALT (SGPT) 64 12 - 78 U/L    Alk. phosphatase 87 45 - 117 U/L    Protein, total 8.1 6.4 - 8.2 g/dL    Albumin 3.9 3.5 - 5.0 g/dL    Globulin 4.2 (H) 2.0 - 4.0 g/dL    A-G Ratio 0.9 (L) 1.1 - 2.2     LIPASE    Collection Time: 07/13/20  3:46 PM   Result Value Ref Range    Lipase 108 73 - 393 U/L   HCG QL SERUM    Collection Time: 07/13/20  3:46 PM   Result Value Ref Range    HCG, Ql. Negative Negative     COVID-19 RAPID TEST    Collection Time: 07/13/20  8:50 PM  Result Value Ref Range    Specimen source Please find results under separate order       COVID-19 rapid test Not Detected Not Detected     MRSA SCREEN - PCR (NASAL)    Collection Time: 07/14/20  2:35 AM   Result Value Ref Range    MRSA by PCR, Nasal Not Detected Not Detected     METABOLIC PANEL, BASIC    Collection Time: 07/14/20  6:24 AM   Result Value Ref Range    Sodium 142 136 - 145 mmol/L    Potassium 3.6 3.5 - 5.1 mmol/L    Chloride 110 (H) 97 - 108 mmol/L    CO2 23 21 - 32 mmol/L    Anion gap 9 5 - 15 mmol/L    Glucose 92 65 - 100 mg/dL    BUN 6 6 - 20 mg/dL    Creatinine 0.77 0.55 - 1.02 mg/dL    BUN/Creatinine ratio 8 (L) 12 - 20      GFR est AA >60 >60 ml/min/1.49m    GFR est non-AA >60 >60 ml/min/1.774m   Calcium 8.8 8.5 - 10.1 mg/dL   CBC W/O DIFF    Collection Time: 07/14/20  6:24 AM   Result Value Ref Range    WBC 8.2 3.6 - 11.0 K/uL    RBC 3.90 3.80 - 5.20 M/uL    HGB 12.9 11.5 - 16.0 g/dL    HCT 38.9 35.0 - 47.0 %    MCV 99.7 (H) 80.0 - 99.0 FL    MCH 33.1 26.0 - 34.0 PG    MCHC 33.2 30.0 - 36.5 g/dL    RDW 11.9 11.5 - 14.5 %    PLATELET 288 150 - 400 K/uL    MPV 9.7 8.9 - 12.9 FL    NRBC 0.0 0.0 PER 100 WBC    ABSOLUTE NRBC 0.00 0.00 - 0.01 K/uL   TYPE & SCREEN    Collection Time: 07/14/20  6:24 AM   Result Value Ref Range    Crossmatch Expiration 07/17/2020,2359     ABO/Rh(D) O Jenetta Downerositive     Antibody screen Negative         CT ABD PELV W CONT   Final Result   Findings consistent with mild or early acute appendicitis. No   perforation or abscess.            Dose reduction: All CT scans at this facility are performed using radiation dose   reduction optimization techniques as appropriate to a performed exam, including   the following: Automated exposure control (AEC), adjustment of the MAA and/or   KUB according to the patient's size, or the patient's use of iterative   reconstruction techniques (ASIR).         Assessment:     Hospital Problems  Never Reviewed          Codes Class Noted POA    Acute appendicitis ICD-10-CM: K35.80  ICD-9-CM: 540.9  07/13/2020 Unknown              Plan:      - NPO  - Continue IV fluids and IV zosyn  - Pain and nausea control  - Scheduled for laparoscopic appendectomy today  - Risks and benefits of the procedure were explained to the patient in detail. These are including but not limited to bleeding, infection, damage to surrounding structures, need for further surgery, risk of anesthesia.  Patient is agreeable and wishes to proceed.  Signed By: Joni Reining Quitman Norberto, DO     July 14, 2020

## 2020-07-14 NOTE — Op Note (Signed)
Brief Postoperative Note    Patient: Alexandra Wang  Date of Birth: [birth year]  MRN: [id number]    Date of Procedure: 07/14/2020     Pre-Op Diagnosis: Acute appendicitis, unspecified acute appendicitis type [K35.80]    Post-Op Diagnosis: Same as preoperative diagnosis.      Procedure(s):  APPENDECTOMY LAPAROSCOPIC    Surgeon(s):  Kalina Morabito, Susette Racer, DO    Surgical Assistant: Surg Asst-1: Wendi Snipes    Anesthesia: General     Estimated Blood Loss (mL): Minimal    Complications: None    Specimens:   ID Type Source Tests Collected by Time Destination   1 : Appendix Preservative Appendix  Zubair Lofton V, DO 07/14/2020 1616 Pathology        Implants: * No implants in log *    Drains: * No LDAs found *    Findings: acute appendicitis    Indications for procedure: Patient is a 50 y.o. female who was found to have acute appendicitis on imaging. She was offered laparoscopic appendectomy as treatment of this condition. Risks and benefits of the procedure were explained to the patient in detail. These are including but not limited to bleeding, infection, damage to surrounding structures, need for further surgery, risk of anesthesia.  Patient is agreeable and wishes to proceed.      Details of Procedure: Patient was brought back to the operating room suite and placed in the supine position on the operating room table.  General anesthesia was performed and preoperative antibiotics were given.  SCDs were placed. The abdomen was prepped and draped in the typical sterile fashion and a timeout was performed confirming correct patient correct procedure correct side.  All present were in agreement.  A 12 mm incision was made supraumbilically and the abdomen was entered using the Hasson technique.  0 Vicryl stay sutures were placed on either side of the fascial opening at the supraumbilical incision and the 12 mm trocar was inserted into the abdomen.  The abdomen was insufflated to 15 mmHg which was well tolerated. Diagnostic  laparoscopy was performed which revealed no abnormalities.  A 5 mm trocar was placed in the suprapubic region as well as one in the left lower quadrant.  The appendix was noted to have acute inflammatory changes.  There was no evidence of perforation or gangrene.  The mesoappendix was taken down using a LigaSure device all the way to the base ensuring adequate hemostasis.  Appendix was then amputated using a laparoscopic stapler with a blue load.  The appendix was then removed through the 12 mm port and the abdomen was inspected for appropriate hemostasis.  The procedure was deemed complete at this time and all ports were removed.  The supraumbilical port site was closed in an interrupted fashion using 0 Vicryl suture.  30 mL of 0.5% Marcaine was injected into the subcutaneous tissues around the port sites.  All port sites were closed using 4-0 Monocryl.  The skin was cleaned and dried and Dermabond was applied to incision sites.  Patient was reversed from anesthesia and transferred to the postanesthesia care unit in stable condition.

## 2020-07-15 MED ORDER — OXYCODONE-ACETAMINOPHEN 5 MG-325 MG TAB
5-325 mg | ORAL_TABLET | Freq: Four times a day (QID) | ORAL | 0 refills | Status: AC | PRN
Start: 2020-07-15 — End: 2020-07-18

## 2020-07-15 MED ORDER — IBUPROFEN 600 MG TAB
600 mg | ORAL_TABLET | Freq: Three times a day (TID) | ORAL | 0 refills | Status: AC | PRN
Start: 2020-07-15 — End: ?

## 2020-07-15 MED FILL — TYLENOL 325 MG TABLET: 325 mg | ORAL | Qty: 2

## 2020-07-15 MED FILL — IBUPROFEN 200 MG TAB: 200 mg | ORAL | Qty: 3

## 2020-07-15 MED FILL — OXYCODONE 5 MG TAB: 5 mg | ORAL | Qty: 1

## 2020-07-15 NOTE — Progress Notes (Signed)
Report given to Susie, RN.

## 2020-07-15 NOTE — Discharge Summary (Signed)
Surgery Discharge Summary    Admit Date:   07/13/2020     Admitting Provider:  Susette Racer Zinnia Tindall, DO    Date of Discharge:  07/16/2020    Admission Diagnoses:  Acute appendicitis [K35.80]    Discharge Diagnoses:  Hospital Problems as of 07/15/2020 Never Reviewed          Codes Class Noted - Resolved POA    Acute appendicitis ICD-10-CM: K35.80  ICD-9-CM: 540.9  07/13/2020 - Present Unknown              Hospital Course:   Patient is a 50 y.o. female who presented to ED with abdominal pain and was diagnosed with acute appendicitis.  She underwent laparoscopic appendectomy on 07/14/2020. Operative course was uneventful. Postoperatively, patient's diet was advanced and her pain was well controlled with PO medications.  She was seen and examined and deemed stable for discharge home on 07/15/2020.  She was provided with medications for pain control and instructed to follow up in my office in 2 weeks. All of her questions were answered to her apparent satisfaction.    Procedures Performed:  Procedure(s):  APPENDECTOMY LAPAROSCOPIC      Consults:  none    Discharge Exam:  General: alert, oriented, in no acute distress  Neck: supple, no masses, no JVD  Cardiovascular: regular rate and rhythm  Pulmonary: unlabored breathing, equal chest rise bilaterally  Abdomen: soft, appropriately tender, non distended, incisions c/d/i  Extremities: no swelling, pulses equal and palpable in all extremities      Discharge Condition:   good    Disposition:  Home    Discharge Medications:  Discharge Medication List as of 07/15/2020 10:30 AM      START taking these medications    Details   ibuprofen (MOTRIN) 600 mg tablet Take 1 Tablet by mouth every eight (8) hours as needed for Pain., Normal, Disp-12 Tablet, R-0      oxyCODONE-acetaminophen (PERCOCET) 5-325 mg per tablet Take 1 Tablet by mouth every six (6) hours as needed for Pain for up to 3 days. Max Daily Amount: 4 Tablets., Normal, Disp-10 Tablet, R-0             Follow-up Care/Patient  Instructions:  Activity: No heavy lifting for 2 weeks  Diet: Regular Diet  Wound Care: None needed    Follow-up Information     Follow up With Specialties Details Why Contact Info    Kechia Yahnke V, DO Surgery Schedule an appointment as soon as possible for a visit on 08/03/2020 Post-op follow up August 04, 2019 2022 at 10:00 am; please arrive by 9:30 am. [address]  [address]  [place] [address]  [phone]      None    None [phone]) Patient stated that they have no PCP            Signed:  Susette Racer Devell Parkerson, DO  07/16/2020  8:50 AM

## 2020-07-15 NOTE — Progress Notes (Signed)
*  ATTENTION:  This note has been created by a medical student for educational purposes only.  Please do not refer to the content of this note for clinical decision-making, billing, or other purposes.  Please see attending physician's note to obtain clinical information on this patient.*     Progress Note    Patient: Alexandra Wang MRN: [id number]  SSN: [id number]    Date of Birth: 11-20-1970  Age: 50 y.o.  Sex: female      Admit Date: 07/13/2020    LOS: 2 days     Subjective:     Patient is POD#1 s/p laparoscopic appendectomy. Doing well. Pain well controlled with current pain medications.  Nausea well controlled. Ambulating and voiding adequately. No shortness of breath or chest pain. Passing flatus, has not had bowel movement this morning.     Objective:     Vitals:    07/14/20 2314 07/15/20 0253 07/15/20 0607 07/15/20 0730   BP: (!) 93/58 95/60 97/64  97/65   Pulse: 74 75 65 65   Resp: 18 20 18 18    Temp: 98.2 F (36.8 C) 99 F (37.2 C)  97.6 F (36.4 C)   SpO2: 97% 97% 96% 92%   Weight:       Height:            Intake and Output:  Current Shift: No intake/output data recorded.  Last three shifts: 02/07 1901 - 02/09 0700  In: 4360.4 [P.O.:300; I.V.:4060.4]  Out: -     Physical Exam:   General: alert, oriented, in no acute distress  Neck: supple, no masses, no JVD  Cardiovascular: regular rate and rhythm  Pulmonary: unlabored breathing, equal chest rise bilaterally, no wheezing or rhonchi  Abdomen: soft, appropriately tender, non distended, incisions clean, dry, and intact  Extremities: no swelling, pulses equal and palpable in all extremities    Lab/Data Review:  No results found for this or any previous visit (from the past 24 hour(s)).       Assessment:     Active Problems:    Acute appendicitis (07/13/2020)      POD#1 s/p laparoscopic appendectomy    Plan:     Diet: regular adult  DVT/GI prophylaxis  Ambulate  Pain and nausea control - does not want narcotic pain medications, would like to continue acetaminophen  and ibuprofen  Dispo: home today    Signed By: Thurston Dana     July 15, 2020

## 2020-07-15 NOTE — Progress Notes (Signed)
Patient discharging home today, no needs. Discharge plan of care/case management plan validated with provider discharge order.

## 2020-08-03 ENCOUNTER — Encounter: Attending: Surgery

## 2023-02-17 ENCOUNTER — Emergency Department: Admit: 2023-02-17 | Payer: MEDICAID

## 2023-02-17 ENCOUNTER — Inpatient Hospital Stay
Admit: 2023-02-17 | Discharge: 2023-02-18 | Disposition: A | Discharge status: Home / Self Care | Payer: MEDICAID | Attending: Emergency Medicine

## 2023-02-17 DIAGNOSIS — R072 Precordial pain: Secondary | ICD-10-CM

## 2023-02-17 LAB — CBC WITH AUTO DIFFERENTIAL
Basophils %: 1 % (ref 0–1)
Basophils Absolute: 0.1 10*3/uL (ref 0.0–0.1)
Eosinophils %: 2 % (ref 0–7)
Eosinophils Absolute: 0.2 10*3/uL (ref 0.0–0.4)
Hematocrit: 43.7 % (ref 35.0–47.0)
Hemoglobin: 14.7 g/dL (ref 11.5–16.0)
Immature Granulocytes %: 0 % (ref 0–0.5)
Immature Granulocytes Absolute: 0 10*3/uL (ref 0.00–0.04)
Lymphocytes %: 46 % (ref 12–49)
Lymphocytes Absolute: 5 10*3/uL — ABNORMAL HIGH (ref 0.8–3.5)
MCH: 33.4 pg (ref 26.0–34.0)
MCHC: 33.6 g/dL (ref 30.0–36.5)
MCV: 99.3 FL — ABNORMAL HIGH (ref 80.0–99.0)
MPV: 9.4 FL (ref 8.9–12.9)
Monocytes %: 6 % (ref 5–13)
Monocytes Absolute: 0.7 10*3/uL (ref 0.0–1.0)
Neutrophils %: 45 % (ref 32–75)
Neutrophils Absolute: 5 10*3/uL (ref 1.8–8.0)
Nucleated RBCs: 0 /100{WBCs}
Platelets: 331 10*3/uL (ref 150–400)
RBC: 4.4 M/uL (ref 3.80–5.20)
RDW: 12.3 % (ref 11.5–14.5)
WBC: 10.9 10*3/uL (ref 3.6–11.0)
nRBC: 0 10*3/uL (ref 0.00–0.01)

## 2023-02-17 LAB — COMPREHENSIVE METABOLIC PANEL W/ REFLEX TO MG FOR LOW K
ALT: 37 U/L (ref 12–78)
AST: 15 U/L (ref 15–37)
Albumin/Globulin Ratio: 1.1 (ref 1.1–2.2)
Albumin: 3.9 g/dL (ref 3.5–5.0)
Alk Phosphatase: 88 U/L (ref 45–117)
Anion Gap: 9 mmol/L (ref 2–12)
BUN/Creatinine Ratio: 16 (ref 12–20)
BUN: 14 mg/dL (ref 6–20)
CO2: 24 mmol/L (ref 21–32)
Calcium: 9.5 mg/dL (ref 8.5–10.1)
Chloride: 108 mmol/L (ref 97–108)
Creatinine: 0.88 mg/dL (ref 0.55–1.02)
Est, Glom Filt Rate: 80 mL/min/{1.73_m2} (ref >60)
Globulin: 3.4 g/dL (ref 2.0–4.0)
Glucose: 101 mg/dL — ABNORMAL HIGH (ref 65–100)
Potassium: 4 mmol/L (ref 3.5–5.1)
Sodium: 141 mmol/L (ref 136–145)
Total Bilirubin: 0.4 mg/dL (ref 0.2–1.0)
Total Protein: 7.3 g/dL (ref 6.4–8.2)

## 2023-02-17 LAB — TROPONIN: Troponin, High Sensitivity: <4 ng/L (ref 0–51)

## 2023-02-17 MED ORDER — KETOROLAC TROMETHAMINE 15 MG/ML IJ SOLN
15 | INTRAMUSCULAR | Status: AC
Start: 2023-02-17 — End: 2023-02-17

## 2023-02-17 MED ORDER — ASPIRIN 325 MG PO TABS
325 | ORAL | Status: AC
Start: 2023-02-17 — End: 2023-02-17

## 2023-02-17 MED ADMIN — ketorolac (TORADOL) injection 15 mg: 15 mg | INTRAVENOUS | @ 23:00:00 | NDC 72266023401

## 2023-02-17 MED ADMIN — aspirin tablet 325 mg: 325 mg | ORAL | @ 23:00:00 | NDC 00536105429

## 2023-02-17 MED FILL — KETOROLAC TROMETHAMINE 15 MG/ML IJ SOLN: 15 MG/ML | INTRAMUSCULAR | Qty: 1

## 2023-02-17 MED FILL — ASPIRIN 325 MG PO TABS: 325 MG | ORAL | Qty: 1

## 2023-02-17 NOTE — ED Triage Notes (Signed)
 C/o bilateral leg pain and swelling since Sunday also reports that on Tuesday she started having some chest pain that comes and goes.

## 2023-02-17 NOTE — ED Provider Notes (Signed)
 [facility]  EMERGENCY DEPARTMENT HISTORY AND PHYSICAL EXAM      Date: 02/17/2023  Patient Name: Alexandra Wang  MRN: [id number]  Birthdate 10-27-1970  Date of evaluation: 02/17/2023  Provider: Carlin Lamar Gula, MD   Note Started: 7:14 PM EDT 02/17/23    HISTORY OF PRESENT ILLNESS     Chief Complaint   Patient presents with    Chest Pain    Leg Pain       History Provided By: Patient    HPI: Alexandra Wang is a 52 y.o. female presents for evaluation of 4 days history of intermittent left-sided and substernal chest pain.  Patient reports the pain is cramping in nature without noted aggravating relieving factors.  Patient also reports this time she is noticed bilateral lower extremity pain and swelling worse with ambulation.  Denies recent surgery, denies recent stasis, denies other DVT risk factors.    PAST MEDICAL HISTORY   Past Medical History:  History reviewed. No pertinent past medical history.    Past Surgical History:  History reviewed. No pertinent surgical history.    Family History:  History reviewed. No pertinent family history.    Social History:  Social History     Tobacco Use    Smoking status: Every Day     Current packs/day: 0.50     Average packs/day: 0.5 packs/day for 35.7 years (17.8 ttl pk-yrs)     Types: Cigarettes     Start date: 1989   Substance Use Topics    Alcohol use: Not Currently    Drug use: Not Currently       Allergies:  Allergies   Allergen Reactions    Levofloxacin Hives       PCP: No primary care provider on file.    Current Meds:   No current facility-administered medications for this encounter.     Current Outpatient Medications   Medication Sig Dispense Refill    ketorolac  (TORADOL ) 10 MG tablet Take 1 tablet by mouth every 6 hours as needed for Pain 20 tablet 0    ibuprofen (ADVIL;MOTRIN) 600 MG tablet Take 1 tablet by mouth every 8 hours as needed         Social Determinants of Health:   Social Determinants of Health     Tobacco Use: High Risk (02/17/2023)    Patient  History     Smoking Tobacco Use: Every Day     Smokeless Tobacco Use: Unknown     Passive Exposure: Not on file   Alcohol Use: Not At Risk (02/17/2023)    AUDIT-C     Frequency of Alcohol Consumption: Never     Average Number of Drinks: Patient does not drink     Frequency of Binge Drinking: Never   Financial Resource Strain: Not on file   Food Insecurity: Not on file   Transportation Needs: Not on file   Physical Activity: Not on file   Stress: Not on file   Social Connections: Not on file   Intimate Partner Violence: Not on file   Depression: Not on file   Housing Stability: Not on file   Interpersonal Safety: Not At Risk (02/17/2023)    Interpersonal Safety Domain Source: IP Abuse Screening     Physical abuse: Denies     Verbal abuse: Denies     Emotional abuse: Denies     Financial abuse: Denies     Sexual abuse: Denies   Utilities: Not on file  PHYSICAL EXAM   Physical Exam  Physical Exam  Constitutional:       General: No acute distress.     Appearance: Normal appearance. Not toxic-appearing.   HENT:      Head: Normocephalic and atraumatic.      Nose: Nose normal.      Mouth/Throat:      Mouth: Mucous membranes are moist.   Eyes:      Extraocular Movements: Extraocular movements intact.      Pupils: Pupils are equal, round, and reactive to light.   Cardiovascular:      Rate and Rhythm: Normal rate.      Pulses: Normal pulses.   Pulmonary:      Effort: Pulmonary effort is normal.      Breath sounds: No stridor, clear to auscultation bilaterally  Abdominal:      General: Abdomen is flat. There is no distension.   Musculoskeletal:         General: Normal range of motion.      Cervical back: Normal range of motion and neck supple.   Skin:     General: Skin is warm and dry.      Capillary Refill: Capillary refill takes less than 2 seconds.   Neurological:      General: No focal deficit present.      Mental Status: Alert and oriented to person, place, and time.         SCREENINGS   History: 0  ECG: 0  Patient  Age: 59  Risk Factors: 0  Troponin: 0  Heart Score Total: 1              No data recorded    LAB, EKG AND DIAGNOSTIC RESULTS   Labs:  Recent Results (from the past 12 hour(s))   CBC with Auto Differential    Collection Time: 02/17/23  6:50 PM   Result Value Ref Range    WBC 10.9 3.6 - 11.0 K/uL    RBC 4.40 3.80 - 5.20 M/uL    Hemoglobin 14.7 11.5 - 16.0 g/dL    Hematocrit 56.2 64.9 - 47.0 %    MCV 99.3 (H) 80.0 - 99.0 FL    MCH 33.4 26.0 - 34.0 PG    MCHC 33.6 30.0 - 36.5 g/dL    RDW 87.6 88.4 - 85.4 %    Platelets 331 150 - 400 K/uL    MPV 9.4 8.9 - 12.9 FL    Nucleated RBCs 0.0 0.0 PER 100 WBC    nRBC 0.00 0.00 - 0.01 K/uL    Neutrophils % 45 32 - 75 %    Lymphocytes % 46 12 - 49 %    Monocytes % 6 5 - 13 %    Eosinophils % 2 0 - 7 %    Basophils % 1 0 - 1 %    Immature Granulocytes % 0 0 - 0.5 %    Neutrophils Absolute 5.0 1.8 - 8.0 K/UL    Lymphocytes Absolute 5.0 (H) 0.8 - 3.5 K/UL    Monocytes Absolute 0.7 0.0 - 1.0 K/UL    Eosinophils Absolute 0.2 0.0 - 0.4 K/UL    Basophils Absolute 0.1 0.0 - 0.1 K/UL    Immature Granulocytes Absolute 0.0 0.00 - 0.04 K/UL    Differential Type AUTOMATED     Comprehensive Metabolic Panel w/ Reflex to MG    Collection Time: 02/17/23  6:50 PM   Result Value Ref Range    Sodium 141 136 -  145 mmol/L    Potassium 4.0 3.5 - 5.1 mmol/L    Chloride 108 97 - 108 mmol/L    CO2 24 21 - 32 mmol/L    Anion Gap 9 2 - 12 mmol/L    Glucose 101 (H) 65 - 100 mg/dL    BUN 14 6 - 20 mg/dL    Creatinine 9.11 9.44 - 1.02 mg/dL    BUN/Creatinine Ratio 16 12 - 20      Est, Glom Filt Rate 80 >60 ml/min/1.77m2    Calcium 9.5 8.5 - 10.1 mg/dL    Total Bilirubin 0.4 0.2 - 1.0 mg/dL    AST 15 15 - 37 U/L    ALT 37 12 - 78 U/L    Alk Phosphatase 88 45 - 117 U/L    Total Protein 7.3 6.4 - 8.2 g/dL    Albumin 3.9 3.5 - 5.0 g/dL    Globulin 3.4 2.0 - 4.0 g/dL    Albumin/Globulin Ratio 1.1 1.1 - 2.2     Troponin    Collection Time: 02/17/23  6:50 PM   Result Value Ref Range    Troponin, High Sensitivity <4 0 -  51 ng/L   Magnesium    Collection Time: 02/17/23  8:51 PM   Result Value Ref Range    Magnesium 2.1 1.6 - 2.4 mg/dL   Brain Natriuretic Peptide    Collection Time: 02/17/23  8:51 PM   Result Value Ref Range    NT Pro-BNP 17 <125 pg/mL   Troponin    Collection Time: 02/17/23  8:51 PM   Result Value Ref Range    Troponin, High Sensitivity <4 0 - 51 ng/L       EKG:.EKG interpreted by me. Shows Normal Sinus Rhythm with a HR of 77 bpm.  Possible left atrial enlargement.  Borderline EKG..  No STEMI.    Radiologic Studies:  Non-plain film images such as CT, Ultrasound and MRI are read by the radiologist. Plain radiographic images are visualized and preliminarily interpreted by the ED Provider with the following findings: Not Applicable.    Interpretation per the Radiologist below, if available at the time of this note:  XR CHEST (2 VW)   Final Result      Normal PA and lateral chest views.         Electronically signed by Marolyn Boroughs           ED COURSE and DIFFERENTIAL DIAGNOSIS/MDM   10:05 PM Differential and Considerations: Patient with many days history of chest pain, unlikely ACS or cardiac dysrhythmia.  Will also evaluate for hemoglobin or other metabolic or renal abnormality.  Bilateral lower extremity edema, not concerning for DVT without any other risk factors.  Afebrile and well-appearing, unlikely serious bacterial infection.    Records Reviewed (source and summary of external notes): Prior medical records and Nursing notes.    Vitals:    Vitals:    02/17/23 1915 02/17/23 1930 02/17/23 2045 02/17/23 2100   BP: 116/79 (!) 108/90 118/85 (!) 121/95   Pulse: 70 72 87 75   Resp: 30 25 30 14    Temp:       TempSrc:       SpO2: 95% 96% 98% 100%   Weight:       Height:            ED COURSE       SEPSIS Reassessment: Sepsis reassessment not applicable    Clinical Management Tools:  Chest Pain: MEDICAL  DECISION MAKING:   I considered, but did not perform, additional testing such CT Angiogram, as well as admission or  transfer to a higher level of care.     I utilized an evidence-based risk rating tool (CMT) along with my training and experience to weigh the risk of discharge against the risks of further testing, imaging, or hospitalization. At this time, I estimate the risks of additional testing, imaging, or hospitalization to be equal to or greater than the risk of discharge(in the case of discharge home).      The patient's HEART Score is 1. In rare cases, I give patients with HEART Score of 4 the option of discharge, but only when they meet criteria for Low 4, meaning that HST was used, and the 4 is not from a highly suspicious story, highly suspicious EKG, or positive cardiac enzymes.  In these selected cases, the risk of a Low 4 is still most likely lower than the risk of admission and further testing/imaging. [id number]    SHARED DECISION MAKING:   I discussed my risk assessment with the patient. The patient understands and consents to the risk of disposition/plan, as well as the risk of uncertainty in estimating outcomes. [id number]          Patient was given the following medications:  Medications   aspirin  tablet 325 mg (325 mg Oral Given 02/17/23 1923)   ketorolac  (TORADOL ) injection 15 mg (15 mg IntraVENous Given 02/17/23 1923)       CONSULTS: See ED Course/MDM for further details.  None     Social Determinants affecting Diagnosis/Treatment: None    Smoking Cessation: Not Applicable    PROCEDURES   Unless otherwise noted above, none  Procedures      CRITICAL CARE TIME   Patient does not meet Critical Care Time, 0 minutes    ED IMPRESSION     1. Chest pain, unspecified type          DISPOSITION/PLAN   DISPOSITION Decision To Discharge 02/17/2023 10:04:42 PM  Condition at Disposition: Good    Discharge Note: The patient is stable for discharge home. The signs, symptoms, diagnosis, and discharge instructions have been discussed, understanding conveyed, and agreed upon. The patient is to follow up as  recommended or return to ER should their symptoms worsen.      PATIENT REFERRED TO:  Fairy Jumper, MD  [address] [facility]  [facility]  [place] [address]  [phone]    Schedule an appointment as soon as possible for a visit   Or follow-up with your cardiologist        DISCHARGE MEDICATIONS:     Medication List        START taking these medications      ketorolac  10 MG tablet  Commonly known as: TORADOL   Take 1 tablet by mouth every 6 hours as needed for Pain            ASK your doctor about these medications      ibuprofen 600 MG tablet  Commonly known as: ADVIL;MOTRIN               Where to Get Your Medications        These medications were sent to [id number] [place], [place] - [address] - P [phone] [place] [phone]  [address] [place], [place] [address]      Phone: [phone]   ketorolac  10 MG tablet  DISCONTINUED MEDICATIONS:  Current Discharge Medication List          I am the Primary Clinician of Record. Carlin Lamar Gula, MD (electronically signed)    (Please note that parts of this dictation were completed with voice recognition software. Quite often unanticipated grammatical, syntax, homophones, and other interpretive errors are inadvertently transcribed by the computer software. Please disregards these errors. Please excuse any errors that have escaped final proofreading.)     Gula Carlin Lamar, MD  02/17/23 2205

## 2023-02-17 NOTE — Discharge Instructions (Addendum)
 Thank you for choosing our Emergency Department for your care.  It is our privilege to care for you in your time of need.  In the next several days, you may receive a survey via email or mailed to your home about your experience with our team.  We would greatly appreciate you taking a few minutes to complete the survey, as we use this information to learn what we have done well and what we could be doing better. Thank you for trusting us  with your care!    Below you will find a list of your tests from today's visit.   Labs  Recent Results (from the past 12 hour(s))   CBC with Auto Differential    Collection Time: 02/17/23  6:50 PM   Result Value Ref Range    WBC 10.9 3.6 - 11.0 K/uL    RBC 4.40 3.80 - 5.20 M/uL    Hemoglobin 14.7 11.5 - 16.0 g/dL    Hematocrit 56.2 64.9 - 47.0 %    MCV 99.3 (H) 80.0 - 99.0 FL    MCH 33.4 26.0 - 34.0 PG    MCHC 33.6 30.0 - 36.5 g/dL    RDW 87.6 88.4 - 85.4 %    Platelets 331 150 - 400 K/uL    MPV 9.4 8.9 - 12.9 FL    Nucleated RBCs 0.0 0.0 PER 100 WBC    nRBC 0.00 0.00 - 0.01 K/uL    Neutrophils % 45 32 - 75 %    Lymphocytes % 46 12 - 49 %    Monocytes % 6 5 - 13 %    Eosinophils % 2 0 - 7 %    Basophils % 1 0 - 1 %    Immature Granulocytes % 0 0 - 0.5 %    Neutrophils Absolute 5.0 1.8 - 8.0 K/UL    Lymphocytes Absolute 5.0 (H) 0.8 - 3.5 K/UL    Monocytes Absolute 0.7 0.0 - 1.0 K/UL    Eosinophils Absolute 0.2 0.0 - 0.4 K/UL    Basophils Absolute 0.1 0.0 - 0.1 K/UL    Immature Granulocytes Absolute 0.0 0.00 - 0.04 K/UL    Differential Type AUTOMATED     Comprehensive Metabolic Panel w/ Reflex to MG    Collection Time: 02/17/23  6:50 PM   Result Value Ref Range    Sodium 141 136 - 145 mmol/L    Potassium 4.0 3.5 - 5.1 mmol/L    Chloride 108 97 - 108 mmol/L    CO2 24 21 - 32 mmol/L    Anion Gap 9 2 - 12 mmol/L    Glucose 101 (H) 65 - 100 mg/dL    BUN 14 6 - 20 mg/dL    Creatinine 9.11 9.44 - 1.02 mg/dL    BUN/Creatinine Ratio 16 12 - 20      Est, Glom Filt Rate 80 >60 ml/min/1.68m2     Calcium 9.5 8.5 - 10.1 mg/dL    Total Bilirubin 0.4 0.2 - 1.0 mg/dL    AST 15 15 - 37 U/L    ALT 37 12 - 78 U/L    Alk Phosphatase 88 45 - 117 U/L    Total Protein 7.3 6.4 - 8.2 g/dL    Albumin 3.9 3.5 - 5.0 g/dL    Globulin 3.4 2.0 - 4.0 g/dL    Albumin/Globulin Ratio 1.1 1.1 - 2.2     Troponin    Collection Time: 02/17/23  6:50 PM   Result Value Ref  Range    Troponin, High Sensitivity <4 0 - 51 ng/L   Magnesium    Collection Time: 02/17/23  8:51 PM   Result Value Ref Range    Magnesium 2.1 1.6 - 2.4 mg/dL   Brain Natriuretic Peptide    Collection Time: 02/17/23  8:51 PM   Result Value Ref Range    NT Pro-BNP 17 <125 pg/mL   Troponin    Collection Time: 02/17/23  8:51 PM   Result Value Ref Range    Troponin, High Sensitivity <4 0 - 51 ng/L       Radiologic Studies  XR CHEST (2 VW)   Final Result      Normal PA and lateral chest views.         Electronically signed by Marolyn Boroughs        ------------------------------------------------------------------------------------------------------------  The evaluation and treatment you received in the Emergency Department were for an urgent problem. It is important that you follow-up with a doctor, nurse practitioner, or physician assistant to:  (1) confirm your diagnosis,  (2) re-evaluation of changes in your illness and treatment, and (3) for ongoing care. Please take your discharge instructions with you when you go to your follow-up appointment.     If you have any problem arranging a follow-up appointment, contact us !  If your symptoms become worse or you do not improve as expected, please return to us . We are available 24 hours a day.     If a prescription has been provided, please fill it as soon as possible to prevent a delay in treatment. If you have any questions or reservations about taking the medication due to side effects or interactions with other medications, please call your primary care provider or contact us  directly.  Again, THANK YOU for choosing us   to care for YOU!

## 2023-02-18 LAB — EKG 12-LEAD
Atrial Rate: 77 {beats}/min
Diagnosis: NORMAL
P Axis: 51 degrees
P-R Interval: 156 ms
Q-T Interval: 388 ms
QRS Duration: 84 ms
QTc Calculation (Bazett): 439 ms
R Axis: 51 degrees
T Axis: 50 degrees
Ventricular Rate: 77 {beats}/min

## 2023-02-18 LAB — MAGNESIUM: Magnesium: 2.1 mg/dL (ref 1.6–2.4)

## 2023-02-18 LAB — BRAIN NATRIURETIC PEPTIDE: NT Pro-BNP: 17 pg/mL (ref <125)

## 2023-02-18 LAB — TROPONIN: Troponin, High Sensitivity: <4 ng/L (ref 0–51)

## 2023-02-18 MED ORDER — KETOROLAC TROMETHAMINE 10 MG PO TABS
10 | ORAL_TABLET | Freq: Four times a day (QID) | ORAL | 0 refills | Status: AC | PRN
Start: 2023-02-18 — End: ?

## 2023-04-29 ENCOUNTER — Inpatient Hospital Stay: Admit: 2023-04-29 | Discharge: 2023-04-29 | Disposition: A | Discharge status: Home / Self Care | Payer: MEDICAID

## 2023-04-29 DIAGNOSIS — G44209 Tension-type headache, unspecified, not intractable: Secondary | ICD-10-CM

## 2023-04-29 MED ORDER — KETOROLAC TROMETHAMINE 30 MG/ML IJ SOLN
30 | INTRAMUSCULAR | Status: AC
Start: 2023-04-29 — End: 2023-04-29
  Administered 2023-04-29: 16:00:00 30 mg via INTRAVENOUS

## 2023-04-29 MED ORDER — METOCLOPRAMIDE HCL 5 MG/ML IJ SOLN
5 | Freq: Once | INTRAMUSCULAR | Status: AC
Start: 2023-04-29 — End: 2023-04-29
  Administered 2023-04-29: 16:00:00 10 mg via INTRAVENOUS

## 2023-04-29 MED ORDER — DEXAMETHASONE SOD PHOSPHATE PF 10 MG/ML IJ SOLN
10 | Freq: Once | INTRAMUSCULAR | Status: AC
Start: 2023-04-29 — End: 2023-04-29
  Administered 2023-04-29: 16:00:00 5 mg via INTRAVENOUS

## 2023-04-29 MED FILL — DEXAMETHASONE SOD PHOSPHATE PF 10 MG/ML IJ SOLN: 10 MG/ML | INTRAMUSCULAR | Qty: 1 | Fill #0

## 2023-04-29 MED FILL — KETOROLAC TROMETHAMINE 30 MG/ML IJ SOLN: 30 MG/ML | INTRAMUSCULAR | Qty: 1 | Fill #0

## 2023-04-29 MED FILL — METOCLOPRAMIDE HCL 5 MG/ML IJ SOLN: 5 MG/ML | INTRAMUSCULAR | Qty: 2 | Fill #0

## 2023-04-29 NOTE — Discharge Instructions (Signed)
 Thank you for choosing our Emergency Department for your care.  It is our privilege to care for you in your time of need.  In the next several days, you may receive a survey via email or mailed to your home about your experience with our team.  We woul

## 2023-04-29 NOTE — ED Triage Notes (Signed)
 HTN and HA. Got new BP med but was afraid to take it.

## 2023-04-29 NOTE — ED Provider Notes (Signed)
 [facility]  EMERGENCY DEPARTMENT HISTORY AND PHYSICAL EXAM      Date: 04/29/2023  Patient Name: Alexandra Wang  MRN: [id number]  Birthdate: 1971/01/06  Date of evaluation: 04/29/2023  Provider: Delphina Cahill, PA-C   Note Started: 12:49 PM EST 11/23/
